# Patient Record
Sex: Female | Born: 1985 | Race: White | Hispanic: No | Marital: Single | Smoking: Former smoker
Health system: Southern US, Community
[De-identification: ages and names within clinical notes are randomized; demographics above are authoritative.]

## PROBLEM LIST (undated history)

## (undated) DIAGNOSIS — E559 Vitamin D deficiency, unspecified: Secondary | ICD-10-CM

## (undated) DIAGNOSIS — F429 Obsessive-compulsive disorder, unspecified: Secondary | ICD-10-CM

## (undated) DIAGNOSIS — R0602 Shortness of breath: Secondary | ICD-10-CM

## (undated) DIAGNOSIS — E669 Obesity, unspecified: Secondary | ICD-10-CM

## (undated) DIAGNOSIS — F319 Bipolar disorder, unspecified: Secondary | ICD-10-CM

## (undated) DIAGNOSIS — F419 Anxiety disorder, unspecified: Secondary | ICD-10-CM

## (undated) DIAGNOSIS — R011 Cardiac murmur, unspecified: Secondary | ICD-10-CM

## (undated) DIAGNOSIS — R5383 Other fatigue: Secondary | ICD-10-CM

## (undated) DIAGNOSIS — K8020 Calculus of gallbladder without cholecystitis without obstruction: Secondary | ICD-10-CM

## (undated) DIAGNOSIS — E88819 Insulin resistance, unspecified: Secondary | ICD-10-CM

## (undated) DIAGNOSIS — E785 Hyperlipidemia, unspecified: Secondary | ICD-10-CM

## (undated) DIAGNOSIS — E8881 Metabolic syndrome: Secondary | ICD-10-CM

## (undated) HISTORY — DX: Anxiety disorder, unspecified: F41.9

## (undated) HISTORY — DX: Vitamin D deficiency, unspecified: E55.9

## (undated) HISTORY — DX: Insulin resistance, unspecified: E88.819

## (undated) HISTORY — DX: Other fatigue: R53.83

## (undated) HISTORY — DX: Shortness of breath: R06.02

## (undated) HISTORY — DX: Bipolar disorder, unspecified: F31.9

## (undated) HISTORY — DX: Obsessive-compulsive disorder, unspecified: F42.9

## (undated) HISTORY — DX: Metabolic syndrome: E88.81

## (undated) HISTORY — DX: Obesity, unspecified: E66.9

## (undated) HISTORY — DX: Hyperlipidemia, unspecified: E78.5

---

## 2010-08-02 ENCOUNTER — Emergency Department (HOSPITAL_COMMUNITY)
Admission: EM | Admit: 2010-08-02 | Discharge: 2010-08-02 | Payer: Self-pay | Source: Home / Self Care | Admitting: Family Medicine

## 2010-10-22 LAB — POCT URINALYSIS DIPSTICK
Protein, ur: 30 mg/dL — AB
Specific Gravity, Urine: >=1.03 (ref 1.005–1.030)
pH: 5.5 (ref 5.0–8.0)

## 2010-10-22 LAB — POCT PREGNANCY, URINE: Preg Test, Ur: NEGATIVE

## 2011-07-04 ENCOUNTER — Other Ambulatory Visit (HOSPITAL_COMMUNITY): Payer: Self-pay | Admitting: Psychiatry

## 2014-09-13 ENCOUNTER — Encounter (HOSPITAL_COMMUNITY): Payer: Self-pay | Admitting: Emergency Medicine

## 2014-09-13 ENCOUNTER — Emergency Department (HOSPITAL_COMMUNITY)
Admission: EM | Admit: 2014-09-13 | Discharge: 2014-09-13 | Disposition: A | Discharge status: Home / Self Care | Payer: BLUE CROSS/BLUE SHIELD | Attending: Emergency Medicine | Admitting: Emergency Medicine

## 2014-09-13 DIAGNOSIS — E669 Obesity, unspecified: Secondary | ICD-10-CM | POA: Insufficient documentation

## 2014-09-13 DIAGNOSIS — Z79899 Other long term (current) drug therapy: Secondary | ICD-10-CM | POA: Insufficient documentation

## 2014-09-13 DIAGNOSIS — R1032 Left lower quadrant pain: Secondary | ICD-10-CM | POA: Diagnosis not present

## 2014-09-13 DIAGNOSIS — Z3202 Encounter for pregnancy test, result negative: Secondary | ICD-10-CM | POA: Diagnosis not present

## 2014-09-13 DIAGNOSIS — R011 Cardiac murmur, unspecified: Secondary | ICD-10-CM | POA: Diagnosis not present

## 2014-09-13 DIAGNOSIS — R102 Pelvic and perineal pain: Secondary | ICD-10-CM | POA: Diagnosis not present

## 2014-09-13 DIAGNOSIS — R109 Unspecified abdominal pain: Secondary | ICD-10-CM

## 2014-09-13 DIAGNOSIS — Z87891 Personal history of nicotine dependence: Secondary | ICD-10-CM | POA: Insufficient documentation

## 2014-09-13 HISTORY — DX: Cardiac murmur, unspecified: R01.1

## 2014-09-13 LAB — COMPREHENSIVE METABOLIC PANEL
ALK PHOS: 67 U/L (ref 39–117)
ALT: 22 U/L (ref 0–35)
ANION GAP: 6 (ref 5–15)
AST: 23 U/L (ref 0–37)
Albumin: 4 g/dL (ref 3.5–5.2)
BILIRUBIN TOTAL: 0.3 mg/dL (ref 0.3–1.2)
BUN: 18 mg/dL (ref 6–23)
CHLORIDE: 106 mmol/L (ref 96–112)
CO2: 27 mmol/L (ref 19–32)
Calcium: 9.2 mg/dL (ref 8.4–10.5)
Creatinine, Ser: 0.92 mg/dL (ref 0.50–1.10)
GFR calc Af Amer: >90 mL/min (ref >90)
GFR, EST NON AFRICAN AMERICAN: 83 mL/min — AB (ref >90)
GLUCOSE: 106 mg/dL — AB (ref 70–99)
Potassium: 3.8 mmol/L (ref 3.5–5.1)
SODIUM: 139 mmol/L (ref 135–145)
TOTAL PROTEIN: 7.6 g/dL (ref 6.0–8.3)

## 2014-09-13 LAB — URINALYSIS, ROUTINE W REFLEX MICROSCOPIC
Bilirubin Urine: NEGATIVE
Glucose, UA: NEGATIVE mg/dL
HGB URINE DIPSTICK: NEGATIVE
Ketones, ur: NEGATIVE mg/dL
Leukocytes, UA: NEGATIVE
Nitrite: NEGATIVE
PH: 6 (ref 5.0–8.0)
Protein, ur: NEGATIVE mg/dL
SPECIFIC GRAVITY, URINE: 1.027 (ref 1.005–1.030)
UROBILINOGEN UA: 0.2 mg/dL (ref 0.0–1.0)

## 2014-09-13 LAB — CBC WITH DIFFERENTIAL/PLATELET
Basophils Absolute: 0.1 10*3/uL (ref 0.0–0.1)
Basophils Relative: 1 % (ref 0–1)
Eosinophils Absolute: 0.5 10*3/uL (ref 0.0–0.7)
Eosinophils Relative: 5 % (ref 0–5)
HEMATOCRIT: 39.3 % (ref 36.0–46.0)
Hemoglobin: 12.5 g/dL (ref 12.0–15.0)
Lymphocytes Relative: 32 % (ref 12–46)
Lymphs Abs: 3.1 10*3/uL (ref 0.7–4.0)
MCH: 26.2 pg (ref 26.0–34.0)
MCHC: 31.8 g/dL (ref 30.0–36.0)
MCV: 82.2 fL (ref 78.0–100.0)
Monocytes Absolute: 0.6 10*3/uL (ref 0.1–1.0)
Monocytes Relative: 6 % (ref 3–12)
NEUTROS ABS: 5.4 10*3/uL (ref 1.7–7.7)
NEUTROS PCT: 56 % (ref 43–77)
PLATELETS: 323 10*3/uL (ref 150–400)
RBC: 4.78 MIL/uL (ref 3.87–5.11)
RDW: 13.5 % (ref 11.5–15.5)
WBC: 9.7 10*3/uL (ref 4.0–10.5)

## 2014-09-13 LAB — WET PREP, GENITAL: TRICH WET PREP: NONE SEEN

## 2014-09-13 LAB — PREGNANCY, URINE: PREG TEST UR: NEGATIVE

## 2014-09-13 LAB — LIPASE, BLOOD: Lipase: 25 U/L (ref 11–59)

## 2014-09-13 MED ORDER — FLUCONAZOLE 150 MG PO TABS
150.0000 mg | ORAL_TABLET | Freq: Once | ORAL | Status: AC
  Administered 2014-09-13: 150 mg via ORAL
  Filled 2014-09-13: qty 1

## 2014-09-13 MED ORDER — IBUPROFEN 800 MG PO TABS: 800.0000 mg | ORAL_TABLET | Freq: Three times a day (TID) | ORAL | Status: DC

## 2014-09-13 MED ORDER — KETOROLAC TROMETHAMINE 30 MG/ML IJ SOLN: 30.0000 mg | Freq: Once | INTRAMUSCULAR | Status: DC

## 2014-09-13 MED ORDER — KETOROLAC TROMETHAMINE 60 MG/2ML IM SOLN
30.0000 mg | Freq: Once | INTRAMUSCULAR | Status: AC
  Administered 2014-09-13: 30 mg via INTRAMUSCULAR
  Filled 2014-09-13: qty 2

## 2014-09-13 MED ORDER — ONDANSETRON HCL 4 MG PO TABS: 4.0000 mg | ORAL_TABLET | Freq: Four times a day (QID) | ORAL | Status: DC

## 2014-09-13 NOTE — Discharge Instructions (Signed)
Abdominal Pain, Women °Abdominal (stomach, pelvic, or belly) pain can be caused by many things. It is important to tell your doctor: °· The location of the pain. °· Does it come and go or is it present all the time? °· Are there things that start the pain (eating certain foods, exercise)? °· Are there other symptoms associated with the pain (fever, nausea, vomiting, diarrhea)? °All of this is helpful to know when trying to find the cause of the pain. °CAUSES  °· Stomach: virus or bacteria infection, or ulcer. °· Intestine: appendicitis (inflamed appendix), regional ileitis (Crohn's disease), ulcerative colitis (inflamed colon), irritable bowel syndrome, diverticulitis (inflamed diverticulum of the colon), or cancer of the stomach or intestine. °· Gallbladder disease or stones in the gallbladder. °· Kidney disease, kidney stones, or infection. °· Pancreas infection or cancer. °· Fibromyalgia (pain disorder). °· Diseases of the female organs: °¨ Uterus: fibroid (non-cancerous) tumors or infection. °¨ Fallopian tubes: infection or tubal pregnancy. °¨ Ovary: cysts or tumors. °¨ Pelvic adhesions (scar tissue). °¨ Endometriosis (uterus lining tissue growing in the pelvis and on the pelvic organs). °¨ Pelvic congestion syndrome (female organs filling up with blood just before the menstrual period). °¨ Pain with the menstrual period. °¨ Pain with ovulation (producing an egg). °¨ Pain with an IUD (intrauterine device, birth control) in the uterus. °¨ Cancer of the female organs. °· Functional pain (pain not caused by a disease, may improve without treatment). °· Psychological pain. °· Depression. °DIAGNOSIS  °Your doctor will decide the seriousness of your pain by doing an examination. °· Blood tests. °· X-rays. °· Ultrasound. °· CT scan (computed tomography, special type of X-ray). °· MRI (magnetic resonance imaging). °· Cultures, for infection. °· Barium enema (dye inserted in the large intestine, to better view it with  X-rays). °· Colonoscopy (looking in intestine with a lighted tube). °· Laparoscopy (minor surgery, looking in abdomen with a lighted tube). °· Major abdominal exploratory surgery (looking in abdomen with a large incision). °TREATMENT  °The treatment will depend on the cause of the pain.  °· Many cases can be observed and treated at home. °· Over-the-counter medicines recommended by your caregiver. °· Prescription medicine. °· Antibiotics, for infection. °· Birth control pills, for painful periods or for ovulation pain. °· Hormone treatment, for endometriosis. °· Nerve blocking injections. °· Physical therapy. °· Antidepressants. °· Counseling with a psychologist or psychiatrist. °· Minor or major surgery. °HOME CARE INSTRUCTIONS  °· Do not take laxatives, unless directed by your caregiver. °· Take over-the-counter pain medicine only if ordered by your caregiver. Do not take aspirin because it can cause an upset stomach or bleeding. °· Try a clear liquid diet (broth or water) as ordered by your caregiver. Slowly move to a bland diet, as tolerated, if the pain is related to the stomach or intestine. °· Have a thermometer and take your temperature several times a day, and record it. °· Bed rest and sleep, if it helps the pain. °· Avoid sexual intercourse, if it causes pain. °· Avoid stressful situations. °· Keep your follow-up appointments and tests, as your caregiver orders. °· If the pain does not go away with medicine or surgery, you may try: °¨ Acupuncture. °¨ Relaxation exercises (yoga, meditation). °¨ Group therapy. °¨ Counseling. °SEEK MEDICAL CARE IF:  °· You notice certain foods cause stomach pain. °· Your home care treatment is not helping your pain. °· You need stronger pain medicine. °· You want your IUD removed. °· You feel faint or   lightheaded.  You develop nausea and vomiting.  You develop a rash.  You are having side effects or an allergy to your medicine. SEEK IMMEDIATE MEDICAL CARE IF:   Your  pain does not go away or gets worse.  You have a fever.  Your pain is felt only in portions of the abdomen. The right side could possibly be appendicitis. The left lower portion of the abdomen could be colitis or diverticulitis.  You are passing blood in your stools (bright red or black tarry stools, with or without vomiting).  You have blood in your urine.  You develop chills, with or without a fever.  You pass out. MAKE SURE YOU:   Understand these instructions.  Will watch your condition.  Will get help right away if you are not doing well or get worse. Document Released: 05/26/2007 Document Revised: 12/13/2013 Document Reviewed: 06/15/2009 [facility] Patient Information 2015 [place], [place]. This information is not intended to replace advice given to you by your health care provider. Make sure you discuss any questions you have with your health care provider.   Emergency Department Resource Guide 1) Find a Doctor and Pay Out of Pocket Although you won't have to find out who is covered by your insurance plan, it is a good idea to ask around and get recommendations. You will then need to call the office and see if the doctor you have chosen will accept you as a new patient and what types of options they offer for patients who are self-pay. Some doctors offer discounts or will set up payment plans for their patients who do not have insurance, but you will need to ask so you aren't surprised when you get to your appointment.  2) Contact Your Local Health Department Not all health departments have doctors that can see patients for sick visits, but many do, so it is worth a call to see if yours does. If you don't know where your local health department is, you can check in your phone book. The CDC also has a tool to help you locate your state's health department, and many state websites also have listings of all of their local health departments.  3) Find a [facility] If your illness  is not likely to be very severe or complicated, you may want to try a walk in clinic. These are popping up all over the country in pharmacies, drugstores, and shopping centers. They're usually staffed by nurse practitioners or physician assistants that have been trained to treat common illnesses and complaints. They're usually fairly quick and inexpensive. However, if you have serious medical issues or chronic medical problems, these are probably not your best option.  No Primary Care Doctor: - Call Health Connect at  [phone] - they can help you locate a primary care doctor that  accepts your insurance, provides certain services, etc. - Physician Referral Service- [phone]  Chronic Pain Problems: Organization         Address  Phone   Notes  [facility]  [phone] Patients need to be referred by their primary care doctor.   Medication Assistance: Organization         Address  Phone   Notes  [facility] Medication [facility] [address] [place], [place] [address] [phone] --Must be a resident of [facility] -- Must have NO insurance coverage whatsoever (no Medicaid/ Medicare, etc.) -- The pt. MUST have a primary care doctor that directs their care  regularly and follows them in the community   [facility]ist  [phone]   [employer]  [phone]    Agencies that provide inexpensive medical care: Organization         Address  Phone   Notes  [place]  [phone]   [facility]    [phone]   [facility] [place], [place] [address] [phone]   [place] [address], [place] [phone]   Planned Parenthood    [phone]   [facility]    [phone]   [place] and [address], [place] Phone:  [phone], Fax:  [phone] Hours of Operation:  9 am - 6  pm, M-F.  Also accepts Medicaid/Medicare and self-pay.  [facility] for [place], [address], [place] Phone: [phone], Fax: [phone]. Hours of Operation:  8:30 am - 5:30 pm, M-F.  Also accepts Medicaid and self-pay.  [facility] [place] [address] [place] Phone: [phone]   [place], [place], [place] [phone], Ext. [phone] Mondays & Thursdays: 7-9 AM.  First 15 patients are seen on a first come, first serve basis.    [place] Providers:  Organization         Address  Phone   Notes  [facility] [address], [place] [phone] Also accepts self-pay patients.  [facility] [address], [place]  [phone]   [place], [address], [place] [phone]   [facility] [address], [place] [phone]   Lucianne Lei [address], [place]   [phone] Only accepts [place] Access [place] patients after they have their name applied to their card.   Self-Pay (no insurance) in [facility]:  Organization         Address  Phone   Notes  Sickle Cell Patients, [facility] [place] [phone]   [facility] [place] [phone]   [facility] [place]  [place], [place], [place] [phone]   [facility]/Dr. Osei-Bonsu  [address] [place] or [address], [place] [phone] Phone number for both [place] and [place] locations is the same.  [facility]gent [facility] and [facility] [address], [place] [phone]   [facility] [address], [place] or [address] [phone]   [facility] [address] [place] [phone], phone; [phone], fax Sees patients 1st and 3rd Saturday of every month.  Must not qualify for public or private insurance (i.e. Medicaid, Medicare, [place] Health Choice, Veterans' Benefits)  Household income should be no more than 200% of the poverty level The clinic cannot treat you if you are pregnant or think you are pregnant  Sexually transmitted diseases are not treated at the clinic.    Dental Care: Organization         Address  Phone  Notes  [facility] Department of [facility] [address] [place], [place] [phone] Accepts children up to age 11  who are enrolled in Medicaid or [place] Health Choice; pregnant women with a Medicaid card; and children who have applied for Medicaid or [place] Health Choice, but were declined, whose parents can pay a reduced fee at time of service.  [facility] Department of [facility]  [address], [place] [phone] Accepts children up to age 30 who are enrolled in [place] or [place]; pregnant women with a Medicaid card; and children who have applied for Medicaid or [place] Health Choice, but were declined, whose parents can pay a reduced fee at time of service.  [place] Adult Dental Access PROGRAM  [place] [phone] Patients are seen by appointment only. Walk-ins are not accepted. [place] will see patients 1 years of age and older. Monday - Tuesday (8am-5pm) Most Wednesdays (8:30-5pm) $30 per visit, cash only  [facility] Adult Dental Access PROGRAM  [address], [facility] [phone] Patients are seen by appointment only. Walk-ins are not accepted. [place] will see patients 80 years of age and older. One Wednesday Evening (Monthly: Volunteer Based).  $30 per visit, cash only  [place]  [phone] for adults; Children under age 69, call Graduate Pediatric Dentistry at [phone]. Children aged 71-14, please call [phone] to request a pediatric application.  Dental services are provided in all areas of dental care including fillings, crowns and bridges, complete and partial dentures, implants, gum treatment, root canals, and extractions. Preventive care is also provided. Treatment is provided to both adults and children. Patients are selected via a lottery and there is often a waiting list.   [facility] [address], [place]  [phone] [email or web address]   [facility] [address] [place], [place] [phone], Ext. [phone] Second and Fourth Thursday of each month, opens at 6:30 AM; Clinic ends at 9 AM.  Patients are seen on a first-come first-served basis, and a limited number are seen during each clinic.   [facility]  [address] [place] Danker [place], [place] [phone]   Eligibility Requirements You must have lived in [place], [place], or [place] counties for at least the last three months.   You cannot be eligible for state or federal sponsored [employer], including [employer], [place], or [employer].   You generally cannot be eligible for healthcare insurance through your employer.    How to apply: Eligibility screenings are held every Tuesday and Wednesday afternoon from 1:00 pm until 4:00 pm. You do not need an appointment for the interview!  [facility] [address], [place], [place]   [place]  [place] Department  [place]  [phone]    Behavioral Health Resources in the Community: Intensive Outpatient Programs Organization         Address  Phone  Notes  [place]. [address] [place], [place] [phone]   [facility] [address], [place], [place]   ADS: Alcohol & Drug Svcs  [address] [place], [place]   [place] [address]  [place], [place] or [phone]   Substance Abuse Resources Organization         Address  Phone  Notes  Alcohol and Drug Services  [phone]   [facility]  [place]   Chinita Pester  [phone]   Residential & Outpatient Substance Abuse Program  [phone]   Psychological Services Organization         Address  Phone  Notes  [place]  [place]  [phone]   [place] [address] [place] or [phone]    Mobile Crisis Teams Organization         Address  Phone  Notes  [facility], Mobile Crisis Care Unit  [phone]   [facility]  [address] [place], [place]   [place] [address], [place] [phone]    Self-Help/Support Groups Organization         Address  Phone             Notes  [place]. of [place] - variety of support groups  [place] Call for more information  Narcotics Anonymous (NA), [facility] [address], [employer]   2 meetings at this location   Special educational needs teacher         Address  Phone  Notes  [facility]idential [facility] [place],    [place]  [phone]   [facility]  [address], [place] [address], [place], [place]   [place], [place] [phone] Admissions: 8am-3pm M-F  Incentives Substance [place] [address]    [place], [place] [phone]   [facility] [address] [place], [place], [place]   [facility] [address]  [place], [place]   [facility] - Intensive Outpatient [address], [place], [place]   [facility] [address]  [place], [place] [phone] or [phone]   [facility] Treatment Services (RTS) [address], [place], [place] Accepts Medicaid  [facility] [place] [address]  [place] [phone] Substance Abuse/Addiction Treatment   [facility] Organization         Address  Phone  Notes  [facility]  [phone]   Domenic Schwab, PhD [address] [place], [place]   [phone] or [phone]   [place], [place] [phone]   [facility] [address], [place], [place] [phone] Insurance/Medicaid/sponsorship through [facility] and Families [address] [place]                                    [place], [place] [phone] [place] [address], [place] [phone]    Dr. Adele Schilder  [phone]   [facility] Dept. 1) [address], [place] 2) [place] 3)  [place] [address], [place] [phone]  [phone]   [place] [phone] or [phone] (After Hours)      You were evaluated in the ED today for abdominal discomfort. There does not appear to be an emergent cause for your symptoms at this time. It is important for you to follow-up with primary care, [place] and wellness within 48 hours for  further evaluation and management of your symptoms. Please keep your regularly scheduled appointment with your GYN for definitive care. Please take your medicines as prescribed. Return to ED for new or worsening symptoms.

## 2014-09-13 NOTE — ED Notes (Signed)
Pt states she had IUD in place for 4.5 years, IUD fell out on 08-13-14, some swelling, period on 09-01-14, terrible cramping on left lower side radiating to side, with intermittent bursts of increased sharpness.

## 2014-09-13 NOTE — ED Provider Notes (Signed)
CSN: [id number]     Arrival date & time 09/13/14  1849 History   First MD Initiated Contact with Patient 09/13/14 2017     Chief Complaint  Patient presents with  . Abdominal Pain     (Consider location/radiation/quality/duration/timing/severity/associated sxs/prior Treatment) HPI Brianna Stokes is a 29 y.o. female who comes in for evaluation of left sided pelvic crampiness that is "different from menstrual cramping". She said this discomfort started on Sunday evening and is crampy with sudden sharp , shooting sensations. She has tried Tylenol, ibuprofen, Motrin, heating pads and hot showers without relief. She rates her discomfort as a 7/10 at this time. She reports she had an IUD in place for 4.5 years, but it fell out on January 2. She has an appointment with her GYN next Friday for further evaluation and management of her symptoms. No vaginal bleeding or discharge. Last bowel movement this morning at 11 and was normal for her. Denies any other symptoms at this time. Denies fevers, headache, vision changes, chest pain, shortness of breath, cough, nausea or vomiting, diarrhea or constipation, urinary symptoms, numbness or weakness, syncope, rash.  Past Medical History  Diagnosis Date  . Heart murmur   . Vaginal delivery    History reviewed. No pertinent past surgical history. History reviewed. No pertinent family history. History  Substance Use Topics  . Smoking status: Former Research scientist (life sciences)  . Smokeless tobacco: Not on file  . Alcohol Use: Yes     Comment: ocassionally   OB History    No data available     Review of Systems  All other systems reviewed and are negative. A 10 point review of systems was completed and was negative except for pertinent positives and negatives as mentioned in the history of present illness    Allergies  Review of patient's allergies indicates no known allergies.  Home Medications   Prior to Admission medications   Medication Sig Start Date End Date  Taking? Authorizing Provider  acetaminophen (TYLENOL) 500 MG tablet Take 1,000 mg by mouth every 6 (six) hours as needed for headache (headache).   Yes Historical Provider, MD  hydrOXYzine (ATARAX/VISTARIL) 10 MG tablet Take 10 mg by mouth 3 (three) times daily as needed for anxiety (anxiety).   Yes Historical Provider, MD  lamoTRIgine (LAMICTAL) 25 MG tablet Take 50 mg by mouth 2 (two) times daily. Take 2 tablets (50 mg) By Mouth BID.   Yes Historical Provider, MD  sertraline (ZOLOFT) 100 MG tablet Take 100 mg by mouth at bedtime.   Yes Historical Provider, MD  ibuprofen (ADVIL,MOTRIN) 800 MG tablet Take 1 tablet (800 mg total) by mouth 3 (three) times daily. 09/13/14   Viona Gilmore [place]ondale Nies, PA-C  ondansetron (ZOFRAN) 4 MG tablet Take 1 tablet (4 mg total) by mouth every 6 (six) hours. 09/13/14   Viona Gilmore Chancey Ringel, PA-C   BP 103/57 mmHg  Pulse 80  Temp(Src) 97.4 F (36.3 C) (Oral)  Resp 18  SpO2 99%  LMP 09/01/2014 Physical Exam  Constitutional: She is oriented to person, place, and time. She appears well-developed and well-nourished.  Obese  HENT:  Head: Normocephalic and atraumatic.  Mouth/Throat: Oropharynx is clear and moist.  Eyes: Conjunctivae are normal. Pupils are equal, round, and reactive to light. Right eye exhibits no discharge. Left eye exhibits no discharge. No scleral icterus.  Neck: Neck supple.  Cardiovascular: Normal rate, regular rhythm and normal heart sounds.   Pulmonary/Chest: Effort normal and breath sounds normal. No respiratory distress. She has no  wheezes. She has no rales.  Abdominal: Soft.  TTP in LLQ and Left pelvis. Abdomen otherwise soft, nondistended, nontender. No rebound or guarding. No other lesions or deformities noted.  Genitourinary:  Chaperone was present for the entire genital exam. No lesions or rashes appreciated on vulva. Cervix visualized on speculum exam and appropriate cultures sampled. Scant blood in vaginal vault. Discharge: Scant white,  thick Upon bi manual exam- No TTP of the adnexa, no cervical motion tenderness. No fullness or masses appreciated. No abnormalities appreciated in structural anatomy.   Musculoskeletal: She exhibits no tenderness.  Neurological: She is alert and oriented to person, place, and time.  Cranial Nerves II-XII grossly intact  Skin: Skin is warm and dry. No rash noted.  Psychiatric: She has a normal mood and affect.  Nursing note and vitals reviewed.   ED Course  Procedures (including critical care time) Labs Review Labs Reviewed  WET PREP, GENITAL - Abnormal; Notable for the following:    Yeast Wet Prep HPF POC RARE (*)    Clue Cells Wet Prep HPF POC FEW (*)    WBC, Wet Prep HPF POC RARE (*)    All other components within normal limits  COMPREHENSIVE METABOLIC PANEL - Abnormal; Notable for the following:    Glucose, Bld 106 (*)    GFR calc non Af Amer 83 (*)    All other components within normal limits  CBC WITH DIFFERENTIAL/PLATELET  LIPASE, BLOOD  URINALYSIS, ROUTINE W REFLEX MICROSCOPIC  PREGNANCY, URINE  GC/CHLAMYDIA PROBE AMP ([place])    Imaging Review No results found.   EKG Interpretation None     Meds given in ED:  Medications  ketorolac (TORADOL) injection 30 mg (30 mg Intramuscular Given 09/13/14 2240)  fluconazole (DIFLUCAN) tablet 150 mg (150 mg Oral Given 09/13/14 2259)    Discharge Medication List as of 09/13/2014 10:45 PM    START taking these medications   Details  ondansetron (ZOFRAN) 4 MG tablet Take 1 tablet (4 mg total) by mouth every 6 (six) hours., Starting 09/13/2014, Until Discontinued, Print       Filed Vitals:   09/13/14 1900 09/13/14 2241  BP: 138/96 103/57  Pulse: 81 80  Temp: 97.4 F (36.3 C)   TempSrc: Oral   Resp: 18 18  SpO2: 100% 99%    MDM  Vitals stable - WNL -afebrile Pt resting comfortably in ED. pain managed in ED. Reports she feels better. PE--no evidence of acute or surgical abdomen. Labwork--evidence of yeast on wet  prep, will treat empirically in ED with Diflucan.  DDX--clinical picture not consistent with PID. Low concern for ovarian torsion, TOA, ectopic. Pt has f/u appt with her GYN next week. Will DC with Zofran, NSAIDs. I discussed all relevant lab findings and imaging results with pt and they verbalized understanding. Discussed f/u with PCP within 48 hrs and return precautions, pt very amenable to plan.  Final diagnoses:  Abdominal discomfort        Verl Dicker, PA-C 09/14/14 [place], MD 09/17/14 [phone]

## 2014-09-14 ENCOUNTER — Emergency Department (HOSPITAL_COMMUNITY): Payer: BLUE CROSS/BLUE SHIELD

## 2014-09-14 ENCOUNTER — Emergency Department (HOSPITAL_COMMUNITY)
Admission: EM | Admit: 2014-09-14 | Discharge: 2014-09-14 | Disposition: A | Discharge status: Home / Self Care | Payer: BLUE CROSS/BLUE SHIELD | Attending: Emergency Medicine | Admitting: Emergency Medicine

## 2014-09-14 ENCOUNTER — Encounter (HOSPITAL_COMMUNITY): Payer: Self-pay

## 2014-09-14 DIAGNOSIS — Z79899 Other long term (current) drug therapy: Secondary | ICD-10-CM | POA: Insufficient documentation

## 2014-09-14 DIAGNOSIS — R011 Cardiac murmur, unspecified: Secondary | ICD-10-CM | POA: Insufficient documentation

## 2014-09-14 DIAGNOSIS — R1013 Epigastric pain: Secondary | ICD-10-CM

## 2014-09-14 DIAGNOSIS — Z87891 Personal history of nicotine dependence: Secondary | ICD-10-CM | POA: Insufficient documentation

## 2014-09-14 DIAGNOSIS — Z3202 Encounter for pregnancy test, result negative: Secondary | ICD-10-CM | POA: Insufficient documentation

## 2014-09-14 DIAGNOSIS — R112 Nausea with vomiting, unspecified: Secondary | ICD-10-CM

## 2014-09-14 DIAGNOSIS — K529 Noninfective gastroenteritis and colitis, unspecified: Secondary | ICD-10-CM

## 2014-09-14 DIAGNOSIS — R10811 Right upper quadrant abdominal tenderness: Secondary | ICD-10-CM

## 2014-09-14 DIAGNOSIS — R197 Diarrhea, unspecified: Secondary | ICD-10-CM

## 2014-09-14 LAB — COMPREHENSIVE METABOLIC PANEL
ALBUMIN: 4.1 g/dL (ref 3.5–5.2)
ALT: 23 U/L (ref 0–35)
ANION GAP: 8 (ref 5–15)
AST: 23 U/L (ref 0–37)
Alkaline Phosphatase: 68 U/L (ref 39–117)
BUN: 20 mg/dL (ref 6–23)
CALCIUM: 9 mg/dL (ref 8.4–10.5)
CO2: 24 mmol/L (ref 19–32)
CREATININE: 0.93 mg/dL (ref 0.50–1.10)
Chloride: 106 mmol/L (ref 96–112)
GFR calc Af Amer: >90 mL/min (ref >90)
GFR calc non Af Amer: 82 mL/min — ABNORMAL LOW (ref >90)
GLUCOSE: 95 mg/dL (ref 70–99)
Potassium: 3.9 mmol/L (ref 3.5–5.1)
SODIUM: 138 mmol/L (ref 135–145)
TOTAL PROTEIN: 7.3 g/dL (ref 6.0–8.3)
Total Bilirubin: 0.6 mg/dL (ref 0.3–1.2)

## 2014-09-14 LAB — URINALYSIS, ROUTINE W REFLEX MICROSCOPIC
GLUCOSE, UA: NEGATIVE mg/dL
Hgb urine dipstick: NEGATIVE
Ketones, ur: NEGATIVE mg/dL
Nitrite: NEGATIVE
PH: 5.5 (ref 5.0–8.0)
Protein, ur: NEGATIVE mg/dL
Specific Gravity, Urine: 1.022 (ref 1.005–1.030)
Urobilinogen, UA: 0.2 mg/dL (ref 0.0–1.0)

## 2014-09-14 LAB — CBC WITH DIFFERENTIAL/PLATELET
Basophils Absolute: 0.1 K/uL (ref 0.0–0.1)
Basophils Relative: 1 % (ref 0–1)
Eosinophils Absolute: 0.6 K/uL (ref 0.0–0.7)
Eosinophils Relative: 6 % — ABNORMAL HIGH (ref 0–5)
HCT: 40.7 % (ref 36.0–46.0)
Hemoglobin: 13 g/dL (ref 12.0–15.0)
Lymphocytes Relative: 32 % (ref 12–46)
Lymphs Abs: 3.3 K/uL (ref 0.7–4.0)
MCH: 25.9 pg — ABNORMAL LOW (ref 26.0–34.0)
MCHC: 31.9 g/dL (ref 30.0–36.0)
MCV: 81.1 fL (ref 78.0–100.0)
Monocytes Absolute: 0.6 K/uL (ref 0.1–1.0)
Monocytes Relative: 6 % (ref 3–12)
Neutro Abs: 5.6 K/uL (ref 1.7–7.7)
Neutrophils Relative %: 55 % (ref 43–77)
Platelets: 311 K/uL (ref 150–400)
RBC: 5.02 MIL/uL (ref 3.87–5.11)
RDW: 13.4 % (ref 11.5–15.5)
WBC: 10.2 K/uL (ref 4.0–10.5)

## 2014-09-14 LAB — I-STAT TROPONIN, ED: Troponin i, poc: 0 ng/mL (ref 0.00–0.08)

## 2014-09-14 LAB — LIPASE, BLOOD: Lipase: 23 U/L (ref 11–59)

## 2014-09-14 LAB — GC/CHLAMYDIA PROBE AMP (~~LOC~~) NOT AT ARMC
Chlamydia: NEGATIVE
NEISSERIA GONORRHEA: NEGATIVE

## 2014-09-14 LAB — URINE MICROSCOPIC-ADD ON

## 2014-09-14 LAB — POC URINE PREG, ED: Preg Test, Ur: NEGATIVE

## 2014-09-14 MED ORDER — MORPHINE SULFATE 4 MG/ML IJ SOLN
4.0000 mg | Freq: Once | INTRAMUSCULAR | Status: AC
  Administered 2014-09-14: 4 mg via INTRAVENOUS
  Filled 2014-09-14: qty 1

## 2014-09-14 MED ORDER — GI COCKTAIL ~~LOC~~
30.0000 mL | Freq: Once | ORAL | Status: AC
  Administered 2014-09-14: 30 mL via ORAL
  Filled 2014-09-14: qty 30

## 2014-09-14 MED ORDER — OMEPRAZOLE 20 MG PO CPDR: 20.0000 mg | DELAYED_RELEASE_CAPSULE | Freq: Every day | ORAL | Status: DC

## 2014-09-14 MED ORDER — MORPHINE SULFATE 4 MG/ML IJ SOLN
8.0000 mg | Freq: Once | INTRAMUSCULAR | Status: AC
  Administered 2014-09-14: 8 mg via INTRAVENOUS
  Filled 2014-09-14: qty 2

## 2014-09-14 MED ORDER — ONDANSETRON 4 MG PO TBDP
4.0000 mg | ORAL_TABLET | Freq: Once | ORAL | Status: AC
  Administered 2014-09-14: 4 mg via ORAL
  Filled 2014-09-14: qty 1

## 2014-09-14 MED ORDER — HYDROCODONE-ACETAMINOPHEN 5-325 MG PO TABS: 1.0000 | ORAL_TABLET | ORAL | Status: DC | PRN

## 2014-09-14 MED ORDER — SODIUM CHLORIDE 0.9 % IV BOLUS (SEPSIS)
500.0000 mL | Freq: Once | INTRAVENOUS | Status: AC
  Administered 2014-09-14: 500 mL via INTRAVENOUS

## 2014-09-14 MED ORDER — PANTOPRAZOLE SODIUM 40 MG IV SOLR
40.0000 mg | Freq: Once | INTRAVENOUS | Status: AC
  Administered 2014-09-14: 40 mg via INTRAVENOUS
  Filled 2014-09-14: qty 40

## 2014-09-14 MED ORDER — ONDANSETRON 8 MG PO TBDP: 8.0000 mg | ORAL_TABLET | Freq: Three times a day (TID) | ORAL | Status: DC | PRN

## 2014-09-14 NOTE — ED Notes (Signed)
Pt c/o generalized abdominal pain, low back pain, and emesis x 3 days.  Pain score 10/10.  [facility], but sts that pain has increased.  Reports taking prescribed medication w/o relief.

## 2014-09-14 NOTE — ED Provider Notes (Signed)
CSN: [id number]     Arrival date & time 09/14/14  1849 History   First MD Initiated Contact with Patient 09/14/14 2000     Chief Complaint  Patient presents with  . Abdominal Pain  . Emesis     (Consider location/radiation/quality/duration/timing/severity/associated sxs/prior Treatment) HPI Comments: Brianna Stokes is a 28 y.o. Healthy female, who presents to the ED with complaints of epigastric abdominal pain that began last night. She was previously seen for lower abdominal pain that began Sunday, and she had a negative workup yesterday and was given diflucan for a yeast infection and sent home with Motrin. She's been taking Motrin which has caused her lower abdominal pain to resolve, but last night she developed 9/10 dull aching and intermittently stabbing epigastric/RUQ abd pain that radiates to her back, worse with lying flat, and unrelieved with Motrin and heat. She states that she has had 5 episodes of nonbloody nonbilious emesis since last night consisting of stomach contents only, and 3 episodes of loose watery nonbloody diarrhea. She denies any fevers, chills, chest pain, shortness of breath, melena, hematochezia, hematemesis, hematuria, dysuria, flank pain, vaginal bleeding or discharge, belching, obstipation, recent travel, antibiotic use, suspicious food intake, sick contacts, or alcohol use. Her last meal was Oodles and noodles yesterday morning, no food consumption prior to onset of this epigastric pain. LMP 09/01/14  Patient is a 29 y.o. female presenting with abdominal pain and vomiting. The history is provided by the patient. No language interpreter was used.  Abdominal Pain Pain location:  Epigastric Pain quality: aching, dull and stabbing   Pain radiates to:  Back Pain severity:  Severe (9/10) Onset quality:  Gradual Duration:  1 day Timing:  Constant Progression:  Unchanged Chronicity:  New Context: recent illness   Relieved by:  Nothing Worsened by:  Position changes  (laying flat) Ineffective treatments:  Heat and NSAIDs Associated symptoms: diarrhea, nausea and vomiting   Associated symptoms: no belching, no chest pain, no chills, no constipation, no dysuria, no fever, no flatus, no hematemesis, no hematochezia, no hematuria, no melena, no shortness of breath, no vaginal bleeding and no vaginal discharge   Risk factors: NSAID use and obesity   Emesis Associated symptoms: abdominal pain and diarrhea   Associated symptoms: no arthralgias, no chills and no myalgias     Past Medical History  Diagnosis Date  . Heart murmur   . Vaginal delivery    History reviewed. No pertinent past surgical history. History reviewed. No pertinent family history. History  Substance Use Topics  . Smoking status: Former Research scientist (life sciences)  . Smokeless tobacco: Not on file  . Alcohol Use: Yes     Comment: ocassionally   OB History    No data available     Review of Systems  Constitutional: Negative for fever and chills.  Respiratory: Negative for shortness of breath.   Cardiovascular: Negative for chest pain.  Gastrointestinal: Positive for nausea, vomiting, abdominal pain and diarrhea. Negative for constipation, blood in stool, melena, hematochezia, rectal pain, flatus and hematemesis.  Endocrine: Negative for polyuria.  Genitourinary: Negative for dysuria, hematuria, flank pain, vaginal bleeding and vaginal discharge.  Musculoskeletal: Negative for myalgias and arthralgias.  Skin: Negative for color change.  Allergic/Immunologic: Negative for immunocompromised state.  Neurological: Negative for weakness, light-headedness and numbness.   10 Systems reviewed and are negative for acute change except as noted in the HPI.    Allergies  Review of patient's allergies indicates no known allergies.  Home Medications   Prior to  Admission medications   Medication Sig Start Date End Date Taking? Authorizing Provider  acetaminophen (TYLENOL) 500 MG tablet Take 1,000 mg by  mouth every 6 (six) hours as needed for headache (headache).   Yes Historical Provider, MD  hydrOXYzine (ATARAX/VISTARIL) 10 MG tablet Take 10 mg by mouth 3 (three) times daily as needed for anxiety (anxiety).   Yes Historical Provider, MD  ibuprofen (ADVIL,MOTRIN) 800 MG tablet Take 1 tablet (800 mg total) by mouth 3 (three) times daily. 09/13/14  Yes Viona Gilmore Cartner, PA-C  lamoTRIgine (LAMICTAL) 25 MG tablet Take 50 mg by mouth 2 (two) times daily.    Yes Historical Provider, MD  ondansetron (ZOFRAN) 4 MG tablet Take 1 tablet (4 mg total) by mouth every 6 (six) hours. 09/13/14  Yes Viona Gilmore Cartner, PA-C  sertraline (ZOLOFT) 100 MG tablet Take 100 mg by mouth at bedtime.   Yes Historical Provider, MD   BP 120/62 mmHg  Pulse 85  Temp(Src) 97.6 F (36.4 C) (Oral)  Resp 20  SpO2 100%  LMP 09/01/2014 Physical Exam  Constitutional: She is oriented to person, place, and time. Vital signs are normal. She appears well-developed and well-nourished.  Non-toxic appearance. No distress.  HENT:  Head: Normocephalic and atraumatic.  Mouth/Throat: Oropharynx is clear and moist and mucous membranes are normal.  Eyes: Conjunctivae and EOM are normal. Right eye exhibits no discharge. Left eye exhibits no discharge.  Neck: Normal range of motion. Neck supple.  Cardiovascular: Normal rate, regular rhythm, normal heart sounds and intact distal pulses.  Exam reveals no gallop and no friction rub.   No murmur heard. Pulmonary/Chest: Effort normal and breath sounds normal. No respiratory distress. She has no decreased breath sounds. She has no wheezes. She has no rhonchi. She has no rales.  Abdominal: Soft. Normal appearance and bowel sounds are normal. She exhibits no distension. There is tenderness in the right upper quadrant and epigastric area. There is positive Murphy's sign. There is no rigidity, no rebound, no guarding, no CVA tenderness and no tenderness at McBurney's point.    Soft, obese which limits  exam but not obviously distended, +BS throughout, with epigastric and RUQ TTP, no r/g/r, +murphy's, neg mcburney's, no CVA TTP   Musculoskeletal: Normal range of motion.  Neurological: She is alert and oriented to person, place, and time. She has normal strength. No sensory deficit.  Skin: Skin is warm, dry and intact. No rash noted.  Psychiatric: She has a normal mood and affect.  Nursing note and vitals reviewed.   ED Course  Procedures (including critical care time) Labs Review Labs Reviewed  CBC WITH DIFFERENTIAL/PLATELET - Abnormal; Notable for the following:    MCH 25.9 (*)    Eosinophils Relative 6 (*)    All other components within normal limits  COMPREHENSIVE METABOLIC PANEL - Abnormal; Notable for the following:    GFR calc non Af Amer 82 (*)    All other components within normal limits  URINALYSIS, ROUTINE W REFLEX MICROSCOPIC - Abnormal; Notable for the following:    Bilirubin Urine MODERATE (*)    Leukocytes, UA TRACE (*)    All other components within normal limits  URINE MICROSCOPIC-ADD ON - Abnormal; Notable for the following:    Bacteria, UA FEW (*)    All other components within normal limits  LIPASE, BLOOD  I-STAT TROPOININ, ED  POC URINE PREG, ED    Imaging Review US Abdomen Limited  09/14/2014   CLINICAL DATA:  Acute onset of right  upper quadrant abdominal tenderness. Nausea and vomiting.  EXAM: US ABDOMEN LIMITED - RIGHT UPPER QUADRANT  COMPARISON:  None.  FINDINGS: Gallbladder:  No gallstones or wall thickening visualized. No sonographic Murphy sign noted.  Common bile duct:  Diameter: 0.3 cm, within normal limits in caliber.  Liver:  No focal lesion identified. Parenchymal echogenicity is mildly heterogeneous but still within normal limits.  IMPRESSION: Unremarkable ultrasound of the right upper quadrant. Gallbladder unremarkable in appearance.   Electronically Signed   By: Garald Balding M.D.   On: 09/14/2014 21:16     EKG Interpretation None      MDM     Final diagnoses:  RUQ abdominal tenderness  Epigastric abdominal pain  Non-intractable vomiting with nausea, vomiting of unspecified type  Diarrhea  Gastroenteritis    29 y.o. female with abd pain, initially LLQ, seen yesterday with essentially neg work up aside from yeast. No significant pelvic exam findings, and GC/CT neg. Now having epigastric/RUQ pain. +Murphys on exam. Seems somewhat like gastritis vs cholelithiasis. Upreg neg. U/A with bili and trace leuks but doesn't appear to be UTI. Trop neg.  CBC w/diff unremarkable, CMP WNL, lipase WNL. Will obtain RUQ u/s. Zofran given with relief of nausea. Will give morphine, protonix, and GI cocktail and have pt attempt PO fluids. DDx includes gastroenteritis vs gastritis from NSAIDs vs cholelithiasis vs early pancreatitis (normal lipase therefore less likely). Will reassess shortly.   10:04 PM U/S negative. This appears to be gastroenteritis/gastritis. Patient still complaining of pain. Will give 8mg  morphine to help with pain, will continue to push PO fluids but since recent BP was lower, will give small bolus.   11:12 PM Pain somewhat improved. Nausea improved and tolerating PO well. Discussed that this will likely take a few days to calm down if it is gastritis from NSAIDs, or viral gastroenteritis. Will give zofran, prilosec, and vicodin. Discussed f/up with PCP in 1wk for recheck. I explained the diagnosis and have given explicit precautions to return to the ER including for any other new or worsening symptoms. The patient understands and accepts the medical plan as it's been dictated and I have answered their questions. Discharge instructions concerning home care and prescriptions have been given. The patient is STABLE and is discharged to home in good condition.  BP 108/60 mmHg  Pulse 71  Temp(Src) 97.6 F (36.4 C) (Oral)  Resp 16  SpO2 98%  LMP 09/01/2014  Meds ordered this encounter  Medications  . ondansetron (ZOFRAN-ODT)  disintegrating tablet 4 mg    Sig:   . gi cocktail (Maalox,Lidocaine,Donnatal)    Sig:   . pantoprazole (PROTONIX) injection 40 mg    Sig:   . morphine 4 MG/ML injection 4 mg    Sig:   . morphine 4 MG/ML injection 8 mg    Sig:   . sodium chloride 0.9 % bolus 500 mL    Sig:   . omeprazole (PRILOSEC) 20 MG capsule    Sig: Take 1 capsule (20 mg total) by mouth daily.    Dispense:  30 capsule    Refill:  0    Order Specific Question:  Supervising Provider    Answer:  Noemi Chapel D [[id number]]  . ondansetron (ZOFRAN ODT) 8 MG disintegrating tablet    Sig: Take 1 tablet (8 mg total) by mouth every 8 (eight) hours as needed for nausea or vomiting.    Dispense:  10 tablet    Refill:  0    Order Specific Question:  Supervising Provider    Answer:  Noemi Chapel D [[id number]]  . HYDROcodone-acetaminophen (NORCO/VICODIN) 5-325 MG per tablet    Sig: Take 1 tablet by mouth every 4 (four) hours as needed for moderate pain or severe pain.    Dispense:  6 tablet    Refill:  0    Order Specific Question:  Supervising Provider    Answer:  Johnna Acosta [address] [place], PA-C 09/14/14 2313  Wandra Arthurs, MD 09/14/14 [phone]

## 2014-09-14 NOTE — Discharge Instructions (Signed)
Your abdominal pain seems to be related to gastritis or gastroenteritis. Stop taking NSAIDs like aleve or ibuprofen which can aggravate your symptoms. Take prilosec daily. Use vicodin as needed for pain but don't drive while taking this.   Use zofran as prescribed, as needed for nausea. Stay well hydrated with small sips of fluids throughout the day. Follow a BRAT (banana-rice-applesauce-toast) diet as described below for the next 24-48 hours. The 'BRAT' diet is suggested, then progress to diet as tolerated as symptoms abate. Call if bloody stools, persistent diarrhea, vomiting, fever or abdominal pain. Return to ER for changing or worsening of symptoms.  Abdominal (belly) pain can be caused by many things. Your caregiver performed an examination and possibly ordered blood/urine tests and imaging (CT scan, x-rays, ultrasound). Many cases can be observed and treated at home after initial evaluation in the emergency department. Even though you are being discharged home, abdominal pain can be unpredictable. Therefore, you need a repeated exam if your pain does not resolve, returns, or worsens. Most patients with abdominal pain don't have to be admitted to the hospital or have surgery, but serious problems like appendicitis and gallbladder attacks can start out as nonspecific pain. Many abdominal conditions cannot be diagnosed in one visit, so follow-up evaluations are very important. SEEK IMMEDIATE MEDICAL ATTENTION IF YOU DEVELOP ANY OF THE FOLLOWING SYMPTOMS:  The pain does not go away or becomes severe.   A temperature above 101 develops.   Repeated vomiting occurs (multiple episodes).   The pain becomes localized to portions of the abdomen. The right side could possibly be appendicitis. In an adult, the left lower portion of the abdomen could be colitis or diverticulitis.   Blood is being passed in stools or vomit (bright red or black tarry stools).   Return also if you develop chest pain,  difficulty breathing, dizziness or fainting, or become confused, poorly responsive, or inconsolable (young children).  The constipation stays for more than 4 days.   There is belly (abdominal) or rectal pain.   You do not seem to be getting better.   Food Choices to Help Relieve Diarrhea When you have diarrhea, the foods you eat and your eating habits are very important. Choosing the right foods and drinks can help relieve diarrhea. Also, because diarrhea can last up to 7 days, you need to replace lost fluids and electrolytes (such as sodium, potassium, and chloride) in order to help prevent dehydration.  WHAT GENERAL GUIDELINES DO I NEED TO FOLLOW?  Slowly drink 1 cup (8 oz) of fluid for each episode of diarrhea. If you are getting enough fluid, your urine will be clear or pale yellow.  Eat starchy foods. Some good choices include white rice, white toast, pasta, low-fiber cereal, baked potatoes (without the skin), saltine crackers, and bagels.  Avoid large servings of any cooked vegetables.  Limit fruit to two servings per day. A serving is  cup or 1 small piece.  Choose foods with less than 2 g of fiber per serving.  Limit fats to less than 8 tsp (38 g) per day.  Avoid fried foods.  Eat foods that have probiotics in them. Probiotics can be found in certain dairy products.  Avoid foods and beverages that may increase the speed at which food moves through the stomach and intestines (gastrointestinal tract). Things to avoid include:  High-fiber foods, such as dried fruit, raw fruits and vegetables, nuts, seeds, and whole grain foods.  Spicy foods and high-fat foods.  Foods and  beverages sweetened with high-fructose corn syrup, honey, or sugar alcohols such as xylitol, sorbitol, and mannitol. WHAT FOODS ARE RECOMMENDED? Grains White rice. White, [place], or pita breads (fresh or toasted), including plain rolls, buns, or bagels. White pasta. Saltine, soda, or graham crackers.  Pretzels. Low-fiber cereal. Cooked cereals made with water (such as cornmeal, farina, or cream cereals). Plain muffins. Matzo. Melba toast. Zwieback.  Vegetables Potatoes (without the skin). Strained tomato and vegetable juices. Most well-cooked and canned vegetables without seeds. Tender lettuce. Fruits Cooked or canned applesauce, apricots, cherries, fruit cocktail, grapefruit, peaches, pears, or plums. Fresh bananas, apples without skin, cherries, grapes, cantaloupe, grapefruit, peaches, oranges, or plums.  Meat and Other Protein Products Baked or boiled chicken. Eggs. Tofu. Fish. Seafood. Smooth peanut butter. Ground or well-cooked tender beef, ham, veal, lamb, pork, or poultry.  Dairy Plain yogurt, kefir, and unsweetened liquid yogurt. Lactose-free milk, buttermilk, or soy milk. Plain hard cheese. Beverages Sport drinks. Clear broths. Diluted fruit juices (except prune). Regular, caffeine-free sodas such as ginger ale. Water. Decaffeinated teas. Oral rehydration solutions. Sugar-free beverages not sweetened with sugar alcohols. Other Bouillon, broth, or soups made from recommended foods.  The items listed above may not be a complete list of recommended foods or beverages. Contact your dietitian for more options. WHAT FOODS ARE NOT RECOMMENDED? Grains Whole grain, whole wheat, bran, or rye breads, rolls, pastas, crackers, and cereals. Wild or brown rice. Cereals that contain more than 2 g of fiber per serving. Corn tortillas or taco shells. Cooked or dry oatmeal. Granola. Popcorn. Vegetables Raw vegetables. Cabbage, broccoli, Brussels sprouts, artichokes, baked beans, beet greens, corn, kale, legumes, peas, sweet potatoes, and yams. Potato skins. Cooked spinach and cabbage. Fruits Dried fruit, including raisins and dates. Raw fruits. Stewed or dried prunes. Fresh apples with skin, apricots, mangoes, pears, raspberries, and strawberries.  Meat and Other Protein Products Chunky peanut butter.  Nuts and seeds. Beans and lentils. Berniece Salines.  Dairy High-fat cheeses. Milk, chocolate milk, and beverages made with milk, such as milk shakes. Cream. Ice cream. Sweets and Desserts Sweet rolls, doughnuts, and sweet breads. Pancakes and waffles. Fats and Oils Butter. Cream sauces. Margarine. Salad oils. Plain salad dressings. Olives. Avocados.  Beverages Caffeinated beverages (such as coffee, tea, soda, or energy drinks). Alcoholic beverages. Fruit juices with pulp. Prune juice. Soft drinks sweetened with high-fructose corn syrup or sugar alcohols. Other Coconut. Hot sauce. Chili powder. Mayonnaise. Gravy. Cream-based or milk-based soups.  The items listed above may not be a complete list of foods and beverages to avoid. Contact your dietitian for more information. WHAT SHOULD I DO IF I BECOME DEHYDRATED? Diarrhea can sometimes lead to dehydration. Signs of dehydration include dark urine and dry mouth and skin. If you think you are dehydrated, you should rehydrate with an oral rehydration solution. These solutions can be purchased at pharmacies, retail stores, or online.  Drink -1 cup (120-240 mL) of oral rehydration solution each time you have an episode of diarrhea. If drinking this amount makes your diarrhea worse, try drinking smaller amounts more often. For example, drink 1-3 tsp (5-15 mL) every 5-10 minutes.  A general rule for staying hydrated is to drink 1-2 L of fluid per day. Talk to your health care provider about the specific amount you should be drinking each day. Drink enough fluids to keep your urine clear or pale yellow. Document Released: 10/19/2003 Document Revised: 08/03/2013 Document Reviewed: 06/21/2013 [facility] Patient Information 2015 [place], [place]. This information is not intended to replace advice given  to you by your health care provider. Make sure you discuss any questions you have with your health care provider.   Abdominal Pain Many things can cause belly (abdominal)  pain. Most times, the belly pain is not dangerous. Many cases of belly pain can be watched and treated at home. HOME CARE   Do not take medicines that help you go poop (laxatives) unless told to by your doctor.  Only take medicine as told by your doctor.  Eat or drink as told by your doctor. Your doctor will tell you if you should be on a special diet. GET HELP IF:  You do not know what is causing your belly pain.  You have belly pain while you are sick to your stomach (nauseous) or have runny poop (diarrhea).  You have pain while you pee or poop.  Your belly pain wakes you up at night.  You have belly pain that gets worse or better when you eat.  You have belly pain that gets worse when you eat fatty foods.  You have a fever. GET HELP RIGHT AWAY IF:   The pain does not go away within 2 hours.  You keep throwing up (vomiting).  The pain changes and is only in the right or left part of the belly.  You have bloody or tarry looking poop. MAKE SURE YOU:   Understand these instructions.  Will watch your condition.  Will get help right away if you are not doing well or get worse. Document Released: 01/15/2008 Document Revised: 08/03/2013 Document Reviewed: 04/07/2013 [facility] Patient Information 2015 [place], [place]. This information is not intended to replace advice given to you by your health care provider. Make sure you discuss any questions you have with your health care provider.  Gastritis, Adult Gastritis is soreness and puffiness (inflammation) of the lining of the stomach. If you do not get help, gastritis can cause bleeding and sores (ulcers) in the stomach. HOME CARE   Only take medicine as told by your doctor.  If you were given antibiotic medicines, take them as told. Finish the medicines even if you start to feel better.  Drink enough fluids to keep your pee (urine) clear or pale yellow.  Avoid foods and drinks that make your problems worse. Foods you may  want to avoid include:  Caffeine or alcohol.  Chocolate.  Mint.  Garlic and onions.  Spicy foods.  Citrus fruits, including oranges, lemons, or limes.  Food containing tomatoes, including sauce, chili, salsa, and pizza.  Fried and fatty foods.  Eat small meals throughout the day instead of large meals. GET HELP RIGHT AWAY IF:   You have black or dark red poop (stools).  You throw up (vomit) blood. It may look like coffee grounds.  You cannot keep fluids down.  Your belly (abdominal) pain gets worse.  You have a fever.  You do not feel better after 1 week.  You have any other questions or concerns. MAKE SURE YOU:   Understand these instructions.  Will watch your condition.  Will get help right away if you are not doing well or get worse. Document Released: 01/15/2008 Document Revised: 10/21/2011 Document Reviewed: 09/11/2011 [facility] Patient Information 2015 [place], [place]. This information is not intended to replace advice given to you by your health care provider. Make sure you discuss any questions you have with your health care provider.  Viral Gastroenteritis Viral gastroenteritis is also called stomach flu. This illness is caused by a certain type of germ (  virus). It can cause sudden watery poop (diarrhea) and throwing up (vomiting). This can cause you to lose body fluids (dehydration). This illness usually lasts for 3 to 8 days. It usually goes away on its own. HOME CARE   Drink enough fluids to keep your pee (urine) clear or pale yellow. Drink small amounts of fluids often.  Ask your doctor how to replace body fluid losses (rehydration).  Avoid:  Foods high in sugar.  Alcohol.  Bubbly (carbonated) drinks.  Tobacco.  Juice.  Caffeine drinks.  Very hot or cold fluids.  Fatty, greasy foods.  Eating too much at one time.  Dairy products until 24 to 48 hours after your watery poop stops.  You may eat foods with active cultures (probiotics).  They can be found in some yogurts and supplements.  Wash your hands well to avoid spreading the illness.  Only take medicines as told by your doctor. Do not give aspirin to children. Do not take medicines for watery poop (antidiarrheals).  Ask your doctor if you should keep taking your regular medicines.  Keep all doctor visits as told. GET HELP RIGHT AWAY IF:   You cannot keep fluids down.  You do not pee at least once every 6 to 8 hours.  You are short of breath.  You see blood in your poop or throw up. This may look like coffee grounds.  You have belly (abdominal) pain that gets worse or is just in one small spot (localized).  You keep throwing up or having watery poop.  You have a fever.  The patient is a child younger than 3 months, and he or she has a fever.  The patient is a child older than 3 months, and he or she has a fever and problems that do not go away.  The patient is a child older than 3 months, and he or she has a fever and problems that suddenly get worse.  The patient is a baby, and he or she has no tears when crying. MAKE SURE YOU:   Understand these instructions.  Will watch your condition.  Will get help right away if you are not doing well or get worse. Document Released: 01/15/2008 Document Revised: 10/21/2011 Document Reviewed: 05/15/2011 [facility] Patient Information 2015 [place], [place]. This information is not intended to replace advice given to you by your health care provider. Make sure you discuss any questions you have with your health care provider.

## 2014-11-09 ENCOUNTER — Emergency Department (HOSPITAL_COMMUNITY): Payer: BLUE CROSS/BLUE SHIELD

## 2014-11-09 ENCOUNTER — Encounter (HOSPITAL_COMMUNITY): Payer: Self-pay | Admitting: *Deleted

## 2014-11-09 ENCOUNTER — Emergency Department (HOSPITAL_COMMUNITY)
Admission: EM | Admit: 2014-11-09 | Discharge: 2014-11-09 | Disposition: A | Discharge status: Home / Self Care | Payer: BLUE CROSS/BLUE SHIELD | Attending: Emergency Medicine | Admitting: Emergency Medicine

## 2014-11-09 DIAGNOSIS — Z79899 Other long term (current) drug therapy: Secondary | ICD-10-CM | POA: Diagnosis not present

## 2014-11-09 DIAGNOSIS — O9989 Other specified diseases and conditions complicating pregnancy, childbirth and the puerperium: Secondary | ICD-10-CM | POA: Diagnosis present

## 2014-11-09 DIAGNOSIS — R011 Cardiac murmur, unspecified: Secondary | ICD-10-CM | POA: Insufficient documentation

## 2014-11-09 DIAGNOSIS — O26899 Other specified pregnancy related conditions, unspecified trimester: Secondary | ICD-10-CM

## 2014-11-09 DIAGNOSIS — R1032 Left lower quadrant pain: Secondary | ICD-10-CM | POA: Insufficient documentation

## 2014-11-09 DIAGNOSIS — Z3A00 Weeks of gestation of pregnancy not specified: Secondary | ICD-10-CM | POA: Diagnosis not present

## 2014-11-09 DIAGNOSIS — Z3491 Encounter for supervision of normal pregnancy, unspecified, first trimester: Secondary | ICD-10-CM

## 2014-11-09 DIAGNOSIS — O210 Mild hyperemesis gravidarum: Secondary | ICD-10-CM | POA: Insufficient documentation

## 2014-11-09 DIAGNOSIS — R1030 Lower abdominal pain, unspecified: Secondary | ICD-10-CM | POA: Insufficient documentation

## 2014-11-09 DIAGNOSIS — Z87891 Personal history of nicotine dependence: Secondary | ICD-10-CM | POA: Insufficient documentation

## 2014-11-09 DIAGNOSIS — R1031 Right lower quadrant pain: Secondary | ICD-10-CM | POA: Diagnosis not present

## 2014-11-09 DIAGNOSIS — R109 Unspecified abdominal pain: Secondary | ICD-10-CM

## 2014-11-09 LAB — CBC WITH DIFFERENTIAL/PLATELET
BASOS ABS: 0 10*3/uL (ref 0.0–0.1)
BASOS PCT: 0 % (ref 0–1)
Eosinophils Absolute: 0.3 10*3/uL (ref 0.0–0.7)
Eosinophils Relative: 3 % (ref 0–5)
HCT: 38.2 % (ref 36.0–46.0)
HEMOGLOBIN: 12.1 g/dL (ref 12.0–15.0)
Lymphocytes Relative: 28 % (ref 12–46)
Lymphs Abs: 3 10*3/uL (ref 0.7–4.0)
MCH: 25.3 pg — ABNORMAL LOW (ref 26.0–34.0)
MCHC: 31.7 g/dL (ref 30.0–36.0)
MCV: 79.7 fL (ref 78.0–100.0)
MONOS PCT: 7 % (ref 3–12)
Monocytes Absolute: 0.8 10*3/uL (ref 0.1–1.0)
Neutro Abs: 6.7 10*3/uL (ref 1.7–7.7)
Neutrophils Relative %: 62 % (ref 43–77)
Platelets: 301 10*3/uL (ref 150–400)
RBC: 4.79 MIL/uL (ref 3.87–5.11)
RDW: 13.3 % (ref 11.5–15.5)
WBC: 10.8 10*3/uL — AB (ref 4.0–10.5)

## 2014-11-09 LAB — URINALYSIS, ROUTINE W REFLEX MICROSCOPIC
Bilirubin Urine: NEGATIVE
Glucose, UA: NEGATIVE mg/dL
Hgb urine dipstick: NEGATIVE
Ketones, ur: 40 mg/dL — AB
Leukocytes, UA: NEGATIVE
NITRITE: NEGATIVE
Protein, ur: NEGATIVE mg/dL
SPECIFIC GRAVITY, URINE: 1.033 — AB (ref 1.005–1.030)
UROBILINOGEN UA: 0.2 mg/dL (ref 0.0–1.0)
pH: 5.5 (ref 5.0–8.0)

## 2014-11-09 LAB — COMPREHENSIVE METABOLIC PANEL
ALT: 15 U/L (ref 0–35)
AST: 16 U/L (ref 0–37)
Albumin: 4.1 g/dL (ref 3.5–5.2)
Alkaline Phosphatase: 53 U/L (ref 39–117)
Anion gap: 8 (ref 5–15)
BILIRUBIN TOTAL: 0.5 mg/dL (ref 0.3–1.2)
BUN: 12 mg/dL (ref 6–23)
CHLORIDE: 107 mmol/L (ref 96–112)
CO2: 23 mmol/L (ref 19–32)
Calcium: 9 mg/dL (ref 8.4–10.5)
Creatinine, Ser: 0.9 mg/dL (ref 0.50–1.10)
GFR calc Af Amer: >90 mL/min (ref >90)
GFR calc non Af Amer: 86 mL/min — ABNORMAL LOW (ref >90)
GLUCOSE: 90 mg/dL (ref 70–99)
POTASSIUM: 3.5 mmol/L (ref 3.5–5.1)
Sodium: 138 mmol/L (ref 135–145)
Total Protein: 7.8 g/dL (ref 6.0–8.3)

## 2014-11-09 LAB — POC URINE PREG, ED: Preg Test, Ur: POSITIVE — AB

## 2014-11-09 LAB — WET PREP, GENITAL
Clue Cells Wet Prep HPF POC: NONE SEEN
Trich, Wet Prep: NONE SEEN
Yeast Wet Prep HPF POC: NONE SEEN

## 2014-11-09 LAB — HCG, QUANTITATIVE, PREGNANCY: hCG, Beta Chain, Quant, S: 15605 m[IU]/mL — ABNORMAL HIGH (ref <5)

## 2014-11-09 MED ORDER — SODIUM CHLORIDE 0.9 % IV BOLUS (SEPSIS)
1000.0000 mL | Freq: Once | INTRAVENOUS | Status: AC
  Administered 2014-11-09: 1000 mL via INTRAVENOUS

## 2014-11-09 MED ORDER — HYDROCODONE-ACETAMINOPHEN 5-325 MG PO TABS: 1.0000 | ORAL_TABLET | Freq: Four times a day (QID) | ORAL | Status: DC | PRN

## 2014-11-09 MED ORDER — MORPHINE SULFATE 4 MG/ML IJ SOLN
4.0000 mg | Freq: Once | INTRAMUSCULAR | Status: AC
  Administered 2014-11-09: 4 mg via INTRAVENOUS
  Filled 2014-11-09: qty 1

## 2014-11-09 MED ORDER — ACETAMINOPHEN 500 MG PO TABS
500.0000 mg | ORAL_TABLET | Freq: Once | ORAL | Status: AC
  Administered 2014-11-09: 500 mg via ORAL
  Filled 2014-11-09: qty 1

## 2014-11-09 NOTE — ED Provider Notes (Signed)
CSN: [id number]     Arrival date & time 11/09/14  1659 History   First MD Initiated Contact with Patient 11/09/14 1758     Chief Complaint  Patient presents with  . Abdominal Cramping     (Consider location/radiation/quality/duration/timing/severity/associated sxs/prior Treatment) HPI Comments: Brianna Stokes is a 29 y.o. [phone] female who presents to the ED with complaints of lower abdominal cramping that began last night and has progressively worsened. She reports the pain is 9/10, located across her lower abdomen, crampy in nature, constant, waxing and waning in severity radiating to her low back, with no known aggravating factors, and unrelieved with heat. Associated symptoms include nausea. She recently found out she was pregnant, and has an appointment next week with Dr. Orlie Pollen in [place], and is unsure of her last menstrual period due to the fact that she had an IUD which "fell out" and she did not have typical menses after that. She denies any fevers, chills, chest pain, shortness breath, vomiting, diarrhea, constipation, obstipation, melena, hematochezia, dysuria, hematuria, vaginal bleeding or discharge, numbness, tingling, weakness, rashes, arthralgias, myalgias, sick contacts or recent travel, antibiotic use, suspicious food intake, or alcohol use. She does admit to taking 800 mg of ibuprofen on Monday but she does not take them regularly.  Patient is a 29 y.o. female presenting with cramps. The history is provided by the patient. No language interpreter was used.  Abdominal Cramping This is a new problem. The current episode started yesterday. The problem occurs constantly. The problem has been waxing and waning. Associated symptoms include abdominal pain and nausea. Pertinent negatives include no arthralgias, change in bowel habit, chest pain, chills, fever, myalgias, numbness, rash, urinary symptoms, vomiting or weakness. Nothing aggravates the symptoms. She has tried heat for  the symptoms. The treatment provided no relief.    Past Medical History  Diagnosis Date  . Heart murmur   . Vaginal delivery    History reviewed. No pertinent past surgical history. No family history on file. History  Substance Use Topics  . Smoking status: Former Research scientist (life sciences)  . Smokeless tobacco: Not on file  . Alcohol Use: Yes     Comment: ocassionally   OB History    No data available     Review of Systems  Constitutional: Negative for fever and chills.  Respiratory: Negative for shortness of breath.   Cardiovascular: Negative for chest pain.  Gastrointestinal: Positive for nausea and abdominal pain. Negative for vomiting, diarrhea, constipation, blood in stool and change in bowel habit.  Genitourinary: Negative for dysuria, hematuria, flank pain, vaginal bleeding and vaginal discharge.  Musculoskeletal: Negative for myalgias and arthralgias.  Skin: Negative for rash.  Allergic/Immunologic: Negative for immunocompromised state.  Neurological: Negative for weakness and numbness.  Psychiatric/Behavioral: Negative for confusion.   10 Systems reviewed and are negative for acute change except as noted in the HPI.    Allergies  Review of patient's allergies indicates no known allergies.  Home Medications   Prior to Admission medications   Medication Sig Start Date End Date Taking? Authorizing Provider  acetaminophen (TYLENOL) 500 MG tablet Take 1,000 mg by mouth every 6 (six) hours as needed for headache (headache).   Yes Historical Provider, MD  hydrOXYzine (ATARAX/VISTARIL) 10 MG tablet Take 10 mg by mouth 3 (three) times daily as needed for anxiety (anxiety).   Yes Historical Provider, MD  ibuprofen (ADVIL,MOTRIN) 800 MG tablet Take 1 tablet (800 mg total) by mouth 3 (three) times daily. 09/13/14  Yes Comer Locket, PA-C  lamoTRIgine (LAMICTAL) 25 MG tablet Take 75 mg by mouth 2 (two) times daily.    Yes Historical Provider, MD  omeprazole (PRILOSEC) 20 MG capsule Take 1  capsule (20 mg total) by mouth daily. 09/14/14  Yes Mcihael Hinderman Camprubi-Soms, PA-C  sertraline (ZOLOFT) 100 MG tablet Take 100 mg by mouth at bedtime.   Yes Historical Provider, MD  HYDROcodone-acetaminophen (NORCO/VICODIN) 5-325 MG per tablet Take 1 tablet by mouth every 4 (four) hours as needed for moderate pain or severe pain. Patient not taking: Reported on 11/09/2014 09/14/14   Clemma Johnsen Camprubi-Soms, PA-C  ondansetron (ZOFRAN ODT) 8 MG disintegrating tablet Take 1 tablet (8 mg total) by mouth every 8 (eight) hours as needed for nausea or vomiting. Patient not taking: Reported on 11/09/2014 09/14/14   Jossiah Smoak Camprubi-Soms, PA-C  ondansetron (ZOFRAN) 4 MG tablet Take 1 tablet (4 mg total) by mouth every 6 (six) hours. Patient not taking: Reported on 11/09/2014 09/13/14   Comer Locket, PA-C   BP 130/77 mmHg  Pulse 96  Temp(Src) 97.8 F (36.6 C) (Oral)  Resp 19  SpO2 100% Physical Exam  Constitutional: She is oriented to person, place, and time. Vital signs are normal. She appears well-developed and well-nourished.  Non-toxic appearance. No distress.  Afebrile, nontoxic, NAD  HENT:  Head: Normocephalic and atraumatic.  Mouth/Throat: Oropharynx is clear and moist and mucous membranes are normal.  Eyes: Conjunctivae and EOM are normal. Right eye exhibits no discharge. Left eye exhibits no discharge.  Neck: Normal range of motion. Neck supple.  Cardiovascular: Normal rate, regular rhythm, normal heart sounds and intact distal pulses.  Exam reveals no gallop and no friction rub.   No murmur heard. Pulmonary/Chest: Effort normal and breath sounds normal. No respiratory distress. She has no decreased breath sounds. She has no wheezes. She has no rhonchi. She has no rales.  Abdominal: Soft. Normal appearance and bowel sounds are normal. She exhibits no distension. There is tenderness in the right lower quadrant, suprapubic area and left lower quadrant. There is no rigidity, no rebound, no guarding, no  CVA tenderness, no tenderness at McBurney's point and negative Murphy's sign.    Soft, obese but not obviously distended, +BS throughout, with diffuse lower abd tenderness, no r/g/r, neg murphy's, neg mcburney's, no CVA TTP   Genitourinary: Vagina normal and uterus normal. Pelvic exam was performed with patient supine. There is no rash, tenderness or lesion on the right labia. There is no rash, tenderness or lesion on the left labia. Cervix exhibits no motion tenderness, no discharge and no friability. Right adnexum displays tenderness. Right adnexum displays no mass and no fullness. Left adnexum displays tenderness. Left adnexum displays no mass and no fullness. No erythema, tenderness or bleeding in the vagina. No vaginal discharge found.  Chaperone present for exam. No rashes, lesions, or tenderness to external genitalia. No erythema, injury, or tenderness to vaginal mucosa. No vaginal discharge or bleeding within vaginal vault. No adnexal masses or fullness, mild diffuse tenderness. No CMT, cervical friability, or discharge from cervical os, which is closed. Uterus non-deviated, mobile, nonTTP, and without enlargement although difficult to palpate secondary to obese habitus.    Musculoskeletal: Normal range of motion.  Neurological: She is alert and oriented to person, place, and time. She has normal strength. No sensory deficit.  Skin: Skin is warm, dry and intact. No rash noted.  Psychiatric: She has a normal mood and affect.  Nursing note and vitals reviewed.   ED Course  Procedures (including critical care time)  Labs Review Labs Reviewed  WET PREP, GENITAL - Abnormal; Notable for the following:    WBC, Wet Prep HPF POC FEW (*)    All other components within normal limits  URINALYSIS, ROUTINE W REFLEX MICROSCOPIC - Abnormal; Notable for the following:    Specific Gravity, Urine 1.033 (*)    Ketones, ur 40 (*)    All other components within normal limits  HCG, QUANTITATIVE, PREGNANCY -  Abnormal; Notable for the following:    hCG, Beta Chain, Quant, S [id number] (*)    All other components within normal limits  CBC WITH DIFFERENTIAL/PLATELET - Abnormal; Notable for the following:    WBC 10.8 (*)    MCH 25.3 (*)    All other components within normal limits  COMPREHENSIVE METABOLIC PANEL - Abnormal; Notable for the following:    GFR calc non Af Amer 86 (*)    All other components within normal limits  POC URINE PREG, ED - Abnormal; Notable for the following:    Preg Test, Ur POSITIVE (*)    All other components within normal limits  RPR  HIV ANTIBODY (ROUTINE TESTING)  ABO/RH  GC/CHLAMYDIA PROBE AMP ([place])    Imaging Review US Ob Comp Less 14 Wks  11/09/2014   CLINICAL DATA:  Pregnant patient with pelvic cramping. Beta HCG not provided. Unknown last menstrual period.  EXAM: OBSTETRIC <14 WK [place] AND TRANSVAGINAL OB US  TECHNIQUE: Both transabdominal and transvaginal ultrasound examinations were performed for complete evaluation of the gestation as well as the maternal uterus, adnexal regions, and pelvic cul-de-sac. Transvaginal technique was performed to assess early pregnancy.  COMPARISON:  None.  FINDINGS: Intrauterine gestational sac: Small ovoid anechoic structure/fluid in the endometrial canal.  Yolk sac:  Not present.  Embryo:  Not present.  Cardiac Activity: Not present.  MSD: 10.7  mm   5 w   6  d          [place] EDC: 07/06/2015  Maternal uterus/adnexae: Probable fibroid in the lower uterine segment measuring 1.1 x 0.6 x 0.9 cm. The left ovary measures 4.1 x 2.1 x 4.3 cm and contains a 2.1 x 1.9 x 2.9 cm cyst. The right ovary measures 3.0 x 1.6 x 3.4 cm. There is trace pelvic free fluid.  IMPRESSION: 1. Probable early intrauterine gestational sac, but no yolk sac, fetal pole, or cardiac activity yet visualized. Recommend follow-up quantitative B-HCG levels and follow-up US in 14 days to confirm and assess viability. Estimated gestational age based on mean sac diameter of 5  weeks 6 days. This recommendation follows SRU consensus guidelines: Diagnostic Criteria for Nonviable Pregnancy Early in the First Trimester. [employer] Med 2013; 937:1696-78. 2. A 2.9 cm cyst in the left ovary, may represent early corpus luteum.   Electronically Signed   By: Jeb Levering M.D.   On: 11/09/2014 19:55   US Ob Transvaginal  11/09/2014   CLINICAL DATA:  Pregnant patient with pelvic cramping. Beta HCG not provided. Unknown last menstrual period.  EXAM: OBSTETRIC <14 WK [place] AND TRANSVAGINAL OB US  TECHNIQUE: Both transabdominal and transvaginal ultrasound examinations were performed for complete evaluation of the gestation as well as the maternal uterus, adnexal regions, and pelvic cul-de-sac. Transvaginal technique was performed to assess early pregnancy.  COMPARISON:  None.  FINDINGS: Intrauterine gestational sac: Small ovoid anechoic structure/fluid in the endometrial canal.  Yolk sac:  Not present.  Embryo:  Not present.  Cardiac Activity: Not present.  MSD: 10.7  mm  5 w   6  d          [place] EDC: 07/06/2015  Maternal uterus/adnexae: Probable fibroid in the lower uterine segment measuring 1.1 x 0.6 x 0.9 cm. The left ovary measures 4.1 x 2.1 x 4.3 cm and contains a 2.1 x 1.9 x 2.9 cm cyst. The right ovary measures 3.0 x 1.6 x 3.4 cm. There is trace pelvic free fluid.  IMPRESSION: 1. Probable early intrauterine gestational sac, but no yolk sac, fetal pole, or cardiac activity yet visualized. Recommend follow-up quantitative B-HCG levels and follow-up US in 14 days to confirm and assess viability. Estimated gestational age based on mean sac diameter of 5 weeks 6 days. This recommendation follows SRU consensus guidelines: Diagnostic Criteria for Nonviable Pregnancy Early in the First Trimester. [employer] Med 2013; 621:3086-57. 2. A 2.9 cm cyst in the left ovary, may represent early corpus luteum.   Electronically Signed   By: Jeb Levering M.D.   On: 11/09/2014 19:55     EKG  Interpretation None      MDM   Final diagnoses:  Abdominal pain during pregnancy  Single pregnancy in first trimester  Abdominal cramping, bilateral lower quadrant    29 y.o. female here with lower abd cramping that began last night, Upreg+, unsure LMP. No vaginal bleeding. Abd exam revealing diffuse lower abd tenderness without peritoneal signs. VSS. Will obtain pelvic, labs, give fluids and tylenol, and obtain U/S to eval fetal status. Will reassess shortly.   7:44 PM Pelvic revealing some diffuse tenderness, no focal area, no CMT. No discharge noted. CBC w/diff showing WBC 10.8 but noncontributory given that it's isolated. CMP pending. U/A without signs of UTI. Quant HCG, ABO/Rh, and ultrasound pending. Pt still having pain, will give morphine, advised pt of risks being unknown to fetus, pt understands and agrees.  10:18 PM Quant HCG [id number]. CMP unremarkable. Wet prep unremarkable. U/S showing early intrauterine gestational sac, no yolk sac or fetal pole, no cardiac activity, likely due to early gestation, estimates it at [redacted]w[redacted]d. ABO/Rh showing O neg but no vaginal bleeding therefore no need for rhogam. Will have her f/up with her OBGYN in 48hrs for recheck of quant hcg and ongoing management. Pt feeling better now after repeat dose of morphine. Again discussed possible risks, and advised fluids and tylenol but will give small supply of norco for any severe pain. I explained the diagnosis and have given explicit precautions to return to the ER including for any other new or worsening symptoms. The patient understands and accepts the medical plan as it's been dictated and I have answered their questions. Discharge instructions concerning home care and prescriptions have been given. The patient is STABLE and is discharged to home in good condition.  BP 98/62 mmHg  Pulse 88  Temp(Src) 97.8 F (36.6 C) (Oral)  Resp 17  SpO2 99%  Meds ordered this encounter  Medications  . acetaminophen  (TYLENOL) tablet 500 mg    Sig:   . sodium chloride 0.9 % bolus 1,000 mL    Sig:   . morphine 4 MG/ML injection 4 mg    Sig:   . morphine 4 MG/ML injection 4 mg    Sig:   . HYDROcodone-acetaminophen (NORCO) 5-325 MG per tablet    Sig: Take 1 tablet by mouth every 6 (six) hours as needed for severe pain.    Dispense:  6 tablet    Refill:  0    Order Specific Question:  Supervising  Provider    Answer:  Noemi Chapel [address] Camprubi-Soms, PA-C 11/09/14 [place], MD 11/10/14 [phone]

## 2014-11-09 NOTE — ED Notes (Signed)
Pt reports she found out she is pregnant x 2 weeks ago, has a pending OB appt for next week.  Started to have cramping last night, denies any bleeding.

## 2014-11-09 NOTE — Discharge Instructions (Signed)
Your abdominal pain could be multiple things, but the labs today were reassuring. Your pregnancy is so early that it's difficult to determine whether it will ultimately be viable or not, but you will need to see your OBGYN in 48 hours for recheck of your quantitative HCG (today it was 15,600) and for ongoing care. Stay well hydrated. Use tylenol as needed for pain, but you may use norco for severe pain. Use sparingly and don't drive while taking it. Return to the ER for changes or worsening symptoms, or vaginal bleeding.  Abdominal (belly) pain can be caused by many things. Your caregiver performed an examination and possibly ordered blood/urine tests and imaging (CT scan, x-rays, ultrasound). Many cases can be observed and treated at home after initial evaluation in the emergency department. Even though you are being discharged home, abdominal pain can be unpredictable. Therefore, you need a repeated exam if your pain does not resolve, returns, or worsens. Most patients with abdominal pain don't have to be admitted to the hospital or have surgery, but serious problems like appendicitis and gallbladder attacks can start out as nonspecific pain. Many abdominal conditions cannot be diagnosed in one visit, so follow-up evaluations are very important. SEEK IMMEDIATE MEDICAL ATTENTION IF YOU DEVELOP ANY OF THE FOLLOWING SYMPTOMS:  The pain does not go away or becomes severe.   A temperature above 101 develops.   Repeated vomiting occurs (multiple episodes).   The pain becomes localized to portions of the abdomen. The right side could possibly be appendicitis. In an adult, the left lower portion of the abdomen could be colitis or diverticulitis.   Blood is being passed in stools or vomit (bright red or black tarry stools).   Return also if you develop chest pain, difficulty breathing, dizziness or fainting, or become confused, poorly responsive, or inconsolable (young children).  The constipation stays  for more than 4 days.   There is belly (abdominal) or rectal pain.   You do not seem to be getting better.     Abdominal Pain During Pregnancy Belly (abdominal) pain is common during pregnancy. Most of the time, it is not a serious problem. Other times, it can be a sign that something is wrong with the pregnancy. Always tell your doctor if you have belly pain. HOME CARE Monitor your belly pain for any changes. The following actions may help you feel better:  Do not have sex (intercourse) or put anything in your vagina until you feel better.  Rest until your pain stops.  Drink clear fluids if you feel sick to your stomach (nauseous). Do not eat solid food until you feel better.  Only take medicine as told by your doctor.  Keep all doctor visits as told. GET HELP RIGHT AWAY IF:   You are bleeding, leaking fluid, or pieces of tissue come out of your vagina.  You have more pain or cramping.  You keep throwing up (vomiting).  You have pain when you pee (urinate) or have blood in your pee.  You have a fever.  You do not feel your baby moving as much.  You feel very weak or feel like passing out.  You have trouble breathing, with or without belly pain.  You have a very bad headache and belly pain.  You have fluid leaking from your vagina and belly pain.  You keep having watery poop (diarrhea).  Your belly pain does not go away after resting, or the pain gets worse. MAKE SURE YOU:   Understand  these instructions.  Will watch your condition.  Will get help right away if you are not doing well or get worse. Document Released: 07/17/2009 Document Revised: 03/31/2013 Document Reviewed: 02/25/2013 [facility] Patient Information 2015 [place], [place]. This information is not intended to replace advice given to you by your health care provider. Make sure you discuss any questions you have with your health care provider.  First Trimester of Pregnancy The first trimester of  pregnancy is from week 1 until the end of week 12 (months 1 through 3). A week after a sperm fertilizes an egg, the egg will implant on the wall of the uterus. This embryo will begin to develop into a baby. Genes from you and your partner are forming the baby. The female genes determine whether the baby is a boy or a girl. At 6-8 weeks, the eyes and face are formed, and the heartbeat can be seen on ultrasound. At the end of 12 weeks, all the baby's organs are formed.  Now that you are pregnant, you will want to do everything you can to have a healthy baby. Two of the most important things are to get good prenatal care and to follow your health care provider's instructions. Prenatal care is all the medical care you receive before the baby's birth. This care will help prevent, find, and treat any problems during the pregnancy and childbirth. BODY CHANGES Your body goes through many changes during pregnancy. The changes vary from woman to woman.   You may gain or lose a couple of pounds at first.  You may feel sick to your stomach (nauseous) and throw up (vomit). If the vomiting is uncontrollable, call your health care provider.  You may tire easily.  You may develop headaches that can be relieved by medicines approved by your health care provider.  You may urinate more often. Painful urination may mean you have a bladder infection.  You may develop heartburn as a result of your pregnancy.  You may develop constipation because certain hormones are causing the muscles that push waste through your intestines to slow down.  You may develop hemorrhoids or swollen, bulging veins (varicose veins).  Your breasts may begin to grow larger and become tender. Your nipples may stick out more, and the tissue that surrounds them (areola) may become darker.  Your gums may bleed and may be sensitive to brushing and flossing.  Dark spots or blotches (chloasma, mask of pregnancy) may develop on your face. This  will likely fade after the baby is born.  Your menstrual periods will stop.  You may have a loss of appetite.  You may develop cravings for certain kinds of food.  You may have changes in your emotions from day to day, such as being excited to be pregnant or being concerned that something may go wrong with the pregnancy and baby.  You may have more vivid and strange dreams.  You may have changes in your hair. These can include thickening of your hair, rapid growth, and changes in texture. Some women also have hair loss during or after pregnancy, or hair that feels dry or thin. Your hair will most likely return to normal after your baby is born. WHAT TO EXPECT AT YOUR PRENATAL VISITS During a routine prenatal visit:  You will be weighed to make sure you and the baby are growing normally.  Your blood pressure will be taken.  Your abdomen will be measured to track your baby's growth.  The fetal heartbeat will  be listened to starting around week 10 or 12 of your pregnancy.  Test results from any previous visits will be discussed. Your health care provider may ask you:  How you are feeling.  If you are feeling the baby move.  If you have had any abnormal symptoms, such as leaking fluid, bleeding, severe headaches, or abdominal cramping.  If you have any questions. Other tests that may be performed during your first trimester include:  Blood tests to find your blood type and to check for the presence of any previous infections. They will also be used to check for low iron levels (anemia) and Rh antibodies. Later in the pregnancy, blood tests for diabetes will be done along with other tests if problems develop.  Urine tests to check for infections, diabetes, or protein in the urine.  An ultrasound to confirm the proper growth and development of the baby.  An amniocentesis to check for possible genetic problems.  Fetal screens for spina bifida and Down syndrome.  You may need  other tests to make sure you and the baby are doing well. HOME CARE INSTRUCTIONS  Medicines  Follow your health care provider's instructions regarding medicine use. Specific medicines may be either safe or unsafe to take during pregnancy.  Take your prenatal vitamins as directed.  If you develop constipation, try taking a stool softener if your health care provider approves. Diet  Eat regular, well-balanced meals. Choose a variety of foods, such as meat or vegetable-based protein, fish, milk and low-fat dairy products, vegetables, fruits, and whole grain breads and cereals. Your health care provider will help you determine the amount of weight gain that is right for you.  Avoid raw meat and uncooked cheese. These carry germs that can cause birth defects in the baby.  Eating four or five small meals rather than three large meals a day may help relieve nausea and vomiting. If you start to feel nauseous, eating a few soda crackers can be helpful. Drinking liquids between meals instead of during meals also seems to help nausea and vomiting.  If you develop constipation, eat more high-fiber foods, such as fresh vegetables or fruit and whole grains. Drink enough fluids to keep your urine clear or pale yellow. Activity and Exercise  Exercise only as directed by your health care provider. Exercising will help you:  Control your weight.  Stay in shape.  Be prepared for labor and delivery.  Experiencing pain or cramping in the lower abdomen or low back is a good sign that you should stop exercising. Check with your health care provider before continuing normal exercises.  Try to avoid standing for long periods of time. Move your legs often if you must stand in one place for a long time.  Avoid heavy lifting.  Wear low-heeled shoes, and practice good posture.  You may continue to have sex unless your health care provider directs you otherwise. Relief of Pain or Discomfort  Wear a good  support bra for breast tenderness.   Take warm sitz baths to soothe any pain or discomfort caused by hemorrhoids. Use hemorrhoid cream if your health care provider approves.   Rest with your legs elevated if you have leg cramps or low back pain.  If you develop varicose veins in your legs, wear support hose. Elevate your feet for 15 minutes, 3-4 times a day. Limit salt in your diet. Prenatal Care  Schedule your prenatal visits by the twelfth week of pregnancy. They are usually scheduled monthly at  first, then more often in the last 2 months before delivery.  Write down your questions. Take them to your prenatal visits.  Keep all your prenatal visits as directed by your health care provider. Safety  Wear your seat belt at all times when driving.  Make a list of emergency phone numbers, including numbers for family, friends, the hospital, and police and fire departments. General Tips  Ask your health care provider for a referral to a local prenatal education class. Begin classes no later than at the beginning of month 6 of your pregnancy.  Ask for help if you have counseling or nutritional needs during pregnancy. Your health care provider can offer advice or refer you to specialists for help with various needs.  Do not use hot tubs, steam rooms, or saunas.  Do not douche or use tampons or scented sanitary pads.  Do not cross your legs for long periods of time.  Avoid cat litter boxes and soil used by cats. These carry germs that can cause birth defects in the baby and possibly loss of the fetus by miscarriage or stillbirth.  Avoid all smoking, herbs, alcohol, and medicines not prescribed by your health care provider. Chemicals in these affect the formation and growth of the baby.  Schedule a dentist appointment. At home, brush your teeth with a soft toothbrush and be gentle when you floss. SEEK MEDICAL CARE IF:   You have dizziness.  You have mild pelvic cramps, pelvic  pressure, or nagging pain in the abdominal area.  You have persistent nausea, vomiting, or diarrhea.  You have a bad smelling vaginal discharge.  You have pain with urination.  You notice increased swelling in your face, hands, legs, or ankles. SEEK IMMEDIATE MEDICAL CARE IF:   You have a fever.  You are leaking fluid from your vagina.  You have spotting or bleeding from your vagina.  You have severe abdominal cramping or pain.  You have rapid weight gain or loss.  You vomit blood or material that looks like coffee grounds.  You are exposed to [place] measles and have never had them.  You are exposed to fifth disease or chickenpox.  You develop a severe headache.  You have shortness of breath.  You have any kind of trauma, such as from a fall or a car accident. Document Released: 07/23/2001 Document Revised: 12/13/2013 Document Reviewed: 06/08/2013 [facility] Patient Information 2015 [place], [place]. This information is not intended to replace advice given to you by your health care provider. Make sure you discuss any questions you have with your health care provider.  Medicines During Pregnancy During pregnancy, there are medicines that are either safe or unsafe to take. Medicines include prescriptions from your caregiver, over-the-counter medicines, topical creams applied to the skin, and all herbal substances. Medicines are put into either Class A, B, C, or D. Class A and B medicines have been shown to be safe in pregnancy. Class C medicines are also considered to be safe in pregnancy, but these medicines should only be used when necessary. Class D medicines should not be used at all in pregnancy. They can be harmful to a baby.  It is best to take as little medicine as possible while pregnant. However, some medicines are necessary to take for the mother and baby's health. Sometimes, it is more dangerous to stop taking certain medicines than to stay on them. This is often the case  for people with long-term (chronic) conditions such as asthma, diabetes, or high blood  pressure (hypertension). If you are pregnant and have a chronic illness, call your caregiver right away. Bring a list of your medicines and their doses to your appointments. If you are planning to become pregnant, schedule a doctor's appointment and discuss your medicines with your caregiver. Lastly, write down the phone number to your pharmacist. They can answer questions regarding a medicine's class and safety. They cannot give advice as to whether you should or should not be on a medicine.  SAFE AND UNSAFE MEDICINES There is a long list of medicines that are considered safe in pregnancy. Below is a shorter list. For specific medicines, ask your caregiver.  AllergyMedicines Loratadine, cetirizine, and chlorpheniramine are safe to take. Certain nasal steroid sprays are safe. Talk to your caregiver about specific brands that are safe. Analgesics Acetaminophen and acetaminophen with codeine are safe to take. All other nonsteroidal anti-inflammatory drugs (NSAIDS) are not safe. This includes ibuprofen.  Antacids Many over-the-counter antacids are safe to take. Talk to your caregiver about specific brands that are safe. Famotidine, ranitidine, and lansoprazole are safe. Omepresole is considered safe to take in the second trimester. Antibiotic Medicines There are several antibiotics to avoid. These include, but are not limited to, tetracyline, quinolones, and sulfa medications. Talk to your caregiver before taking any antibiotic.  Antihistamines Talk to your caregiver about specific brands that are safe.  Asthma Medicines Most asthma steroid inhalers are safe to take. Talk to your caregiver for specific details. Calcium Calcium supplements are safe to take. Do not take oyster shell calcium.  Cough and Cold Medicines It is safe to take products with guaifenesin or dextromethorphan. Talk to your caregiver about  specific brands that are safe. It is not safe to take products that contain aspirin or ibuprofen. Decongestant Medicines Pseudoephedrine-containing products are safe to take in the second and third trimester.  Depression Medicines Talk about these medicines with your caregiver.  Antidiarrheal Medicines It is safe to take loperamide. Talk to your caregiver about specific brands that are safe. It is not safe to take any antidiarrheal medicine that contains bismuth. Eyedrops Allergy eyedrops should be limited.  Iron It is safe to use certain iron-containing medicines for anemia in pregnancy. They require a prescription.  Antinausea Medicines It is safe to take doxylamine and vitamin B6 as directed. There are other prescription medicines available, if needed.  Sleep aids It is safe to take diphenhydramine and acetaminophen with diphenhydramine.  Steroids Hydrocortisone creams are safe to use as directed. Oral steroids require a prescription. It is not safe to take any hemorrhoid cream with pramoxine or phenylephrine. Stool softener It is safe to take stool softener medicines. Avoid daily or prolonged use of stool softeners. Thyroid Medicine It is important to stay on this thyroid medicine. It needs to be followed by your caregiver.  Vaginal Medicines Your caregiver will prescribe a medicine to you if you have a vaginal infection. Certain antifungal medicines are safe to use if you have a sexually transmitted infection (STI). Talk to your caregiver.  Document Released: 07/29/2005 Document Revised: 10/21/2011 Document Reviewed: 07/30/2011 [facility] Patient Information 2015 [place], [place]. This information is not intended to replace advice given to you by your health care provider. Make sure you discuss any questions you have with your health care provider.

## 2014-11-09 NOTE — ED Notes (Signed)
IV insertion attempted x 2, unable to obtain IV access or blood work. Another RN to attempt.

## 2014-11-10 LAB — GC/CHLAMYDIA PROBE AMP (~~LOC~~) NOT AT ARMC
Chlamydia: NEGATIVE
Neisseria Gonorrhea: NEGATIVE

## 2014-11-10 LAB — HIV ANTIBODY (ROUTINE TESTING W REFLEX): HIV SCREEN 4TH GENERATION: NONREACTIVE

## 2014-11-10 LAB — ABO/RH
ABO/RH(D): O NEG
Antibody Screen: NEGATIVE

## 2014-11-10 LAB — RPR: RPR: NONREACTIVE

## 2015-09-28 ENCOUNTER — Encounter: Payer: Self-pay | Admitting: Internal Medicine

## 2015-09-28 ENCOUNTER — Ambulatory Visit (INDEPENDENT_AMBULATORY_CARE_PROVIDER_SITE_OTHER): Payer: 59 | Admitting: Internal Medicine

## 2015-09-28 VITALS — BP 100/80 | HR 89 | Ht 68.0 in | Wt 193.6 lb

## 2015-09-28 DIAGNOSIS — R55 Syncope and collapse: Secondary | ICD-10-CM | POA: Diagnosis not present

## 2015-09-28 NOTE — Progress Notes (Signed)
ELECTROPHYSIOLOGY CONSULT NOTE  Patient ID: Brianna Stokes, MRN: [id number], DOB/AGE: 31/05/1986 31 y.o. Admit date: (Not on file) Date of Consult: 09/28/2015  Primary Physician: Lorelei Pont, MD [facility] [place]  Chief Complaint: palpitations   HPI Brianna Stokes is a 30 y.o. female  Referred because of palpitations and presyncope.  She introduces herself as having anxiety and borderline personality disorder. She notes that over the last 6 months she's had increasing problems with heat intolerance manifested and showers, orthostatic intolerance with modest standing in exercise intolerance with tachypalpitations upon climbing stairs.  These efforts are all associated with presyncope. She has had no syncope.  She's undergone a fairly extensive evaluation at [facility]. This is included an echocardiogram which noted a restrictive ASD described as less than 10 mm in size. There is a repeat echocardiogram 4 months later i.e. 2/17 described a similar sized ASD. The patient understands that she has a "rapidly enlarging defect".  She's also been told that her palpitations are related to her heart defect.  Her diet is salt deplete as well as fluid deplete.     Past Medical History  Diagnosis Date  . Heart murmur   . Vaginal delivery   . Anxiety   . Bipolar 1 disorder ([place])   . OCD (obsessive compulsive disorder)   . Obesity   . Hyperlipidemia     mixed  . Vitamin D deficiency   . SOB (shortness of breath)   . Fatigue   . Insulin resistance       Surgical History: History reviewed. No pertinent past surgical history.   Home Meds: Prior to Admission medications   Medication Sig Start Date End Date Taking? Authorizing Provider  hydrOXYzine (ATARAX/VISTARIL) 10 MG tablet Take 10 mg by mouth 3 (three) times daily as needed for anxiety (anxiety).   Yes Historical Provider, MD  lamoTRIgine (LAMICTAL) 25 MG tablet Take 75 mg by mouth 2  (two) times daily.    Yes Historical Provider, MD  Liraglutide -Weight Management (SAXENDA) 18 MG/3ML SOPN Inject into the skin daily.   Yes Historical Provider, MD  omeprazole (PRILOSEC) 20 MG capsule Take 1 capsule (20 mg total) by mouth daily. 09/14/14  Yes Mercedes Camprubi-Soms, PA-C  sertraline (ZOLOFT) 100 MG tablet Take 100 mg by mouth daily.   Yes Historical Provider, MD  Topiramate ER (TROKENDI XR) 50 MG CP24 Take 50 mg by mouth daily.   Yes Historical Provider, MD    Allergies: No Known Allergies  Social History   Social History  . Marital Status: Single    Spouse Name: N/A  . Number of Children: N/A  . Years of Education: N/A   Occupational History  . Not on file.   Social History Main Topics  . Smoking status: Former Research scientist (life sciences)  . Smokeless tobacco: Not on file  . Alcohol Use: Yes     Comment: ocassionally  . Drug Use: No  . Sexual Activity: Not on file   Other Topics Concern  . Not on file   Social History Narrative     Family History  Problem Relation Age of Onset  . Depression Mother   . Diabetes Mellitus I Mother      ROS:  Please see the history of present illness.     All other systems reviewed and negative.    Physical Exam:   Blood pressure 100/80, pulse 89, height 5\' 8"  (1.727 m), weight 193 lb 9.6 oz (87.816 kg). General: Well  developed, well nourished female in no acute distress. Head: Normocephalic, atraumatic, sclera non-icteric, no xanthomas, nares are without discharge. EENT: normal  Lymph Nodes:  none Neck: Negative for carotid bruits. JVD not elevated. Back:without scoliosis kyphosis  Lungs: Clear bilaterally to auscultation without wheezes, rales, or rhonchi. Breathing is unlabored. Heart: RRR with S1 S2. 2 */6 systolic murmur . No rubs, or gallops appreciated. Abdomen: Soft, non-tender, non-distended with normoactive bowel sounds. No hepatomegaly. No rebound/guarding. No obvious abdominal masses. Msk:  Strength and tone appear normal for  age. Extremities: No clubbing or cyanosis. No  edema.  Distal pedal pulses are 2+ and equal bilaterally. Skin: Warm and Dry Neuro: Alert and oriented X 3. CN III-XII intact Grossly normal sensory and motor function . Psych:  Responds to questions appropriately with a normal affect.      Labs: Cardiac Enzymes No results for input(s): CKTOTAL, CKMB, TROPONINI in the last 72 hours. CBC Lab Results  Component Value Date   WBC 10.8* 11/09/2014   HGB 12.1 11/09/2014   HCT 38.2 11/09/2014   MCV 79.7 11/09/2014   PLT 301 11/09/2014   PROTIME: No results for input(s): LABPROT, INR in the last 72 hours. Chemistry No results for input(s): NA, K, CL, CO2, BUN, CREATININE, CALCIUM, PROT, BILITOT, ALKPHOS, ALT, AST, GLUCOSE in the last 168 hours.  Invalid input(s): LABALBU Lipids No results found for: CHOL, HDL, LDLCALC, TRIG BNP No results found for: PROBNP Thyroid Function Tests: No results for input(s): TSH, T4TOTAL, T3FREE, THYROIDAB in the last 72 hours.  Invalid input(s): FREET3 Miscellaneous No results found for: DDIMER  Radiology/Studies:  No results found.  EKG: NSR 89 26/09/36   Assessment and Plan:  ASD/VSD  -restrictive  Dysautonomia with tachy palpitations and presyncope  Borderline personality disorder      The patient's echo reports were reviewed. I discussed them with 2 colleagues. I also reviewed up to date.I could find no reason that she couldn't fly. There are issues related to high altitude presumably for large defects and there are strict admonitions against scuba diving.   The clinical implications of the defects which are described as restrictive should be minimal. I confirmed this with up to date. This is in stark contrast to what she understood was told her at [facility]. She was also told at [facility] at her ASD is enlarging rapidly. I told her I was unable to speak to this contradiction. I suggested that perhaps further consultation with a third party  might be appropriate  I also offered to have our physicians review the echoes and felt that she could get copies of the studies  In addition, my interpretation of the mechanism of her palpitations and presyncope is discordant with her understanding that she has a serious heart rhythm problem. We discussed extensively the issues of dysautonomia, the physiology of orthstasis and positional stress.  We discussed the role of salt and water repletion, the importance of exercise, often needing to be started in the recumbent position, and the awareness of triggers and the role of ambient heat and dehydration  She is not certain as to whether my interpretations are credible or not. I have again suggested that she seek out a third party consultation and given her the need for Dr. Norville Haggard . I have also mentioned that there is significant interplay between neuropsychiatric disease and dysautonomic symptoms.  We discussed potential mechanism of onset been abrupt and possibly related to viral illness as well as her poor ability to prognosticate as  to its future course   we spent 1 hour and 45 minutes      Virl Axe

## 2015-09-28 NOTE — Patient Instructions (Signed)
Medication Instructions:  Your physician recommends that you continue on your current medications as directed. Please refer to the Current Medication list given to you today.   Labwork: none  Testing/Procedures: none  Follow-Up: Your physician recommends that you schedule a follow-up appointment in: as needed.    

## 2015-10-03 ENCOUNTER — Encounter: Payer: Self-pay | Admitting: Internal Medicine

## 2015-11-15 ENCOUNTER — Inpatient Hospital Stay (HOSPITAL_COMMUNITY)
Admission: EM | Admit: 2015-11-15 | Discharge: 2015-11-17 | DRG: 418 | Disposition: A | Discharge status: Home / Self Care | Payer: Self-pay | Attending: General Surgery | Admitting: General Surgery

## 2015-11-15 ENCOUNTER — Emergency Department (HOSPITAL_COMMUNITY): Payer: Self-pay

## 2015-11-15 ENCOUNTER — Encounter (HOSPITAL_COMMUNITY): Payer: Self-pay

## 2015-11-15 DIAGNOSIS — Q210 Ventricular septal defect: Secondary | ICD-10-CM

## 2015-11-15 DIAGNOSIS — K8000 Calculus of gallbladder with acute cholecystitis without obstruction: Principal | ICD-10-CM | POA: Diagnosis present

## 2015-11-15 DIAGNOSIS — R1011 Right upper quadrant pain: Secondary | ICD-10-CM

## 2015-11-15 DIAGNOSIS — K8020 Calculus of gallbladder without cholecystitis without obstruction: Secondary | ICD-10-CM | POA: Diagnosis present

## 2015-11-15 DIAGNOSIS — F419 Anxiety disorder, unspecified: Secondary | ICD-10-CM | POA: Diagnosis present

## 2015-11-15 DIAGNOSIS — R1110 Vomiting, unspecified: Secondary | ICD-10-CM

## 2015-11-15 DIAGNOSIS — F429 Obsessive-compulsive disorder, unspecified: Secondary | ICD-10-CM | POA: Diagnosis present

## 2015-11-15 DIAGNOSIS — E785 Hyperlipidemia, unspecified: Secondary | ICD-10-CM | POA: Diagnosis present

## 2015-11-15 DIAGNOSIS — Z87891 Personal history of nicotine dependence: Secondary | ICD-10-CM

## 2015-11-15 DIAGNOSIS — F319 Bipolar disorder, unspecified: Secondary | ICD-10-CM | POA: Diagnosis present

## 2015-11-15 HISTORY — DX: Calculus of gallbladder without cholecystitis without obstruction: K80.20

## 2015-11-15 LAB — CBC
HEMATOCRIT: 37.6 % (ref 36.0–46.0)
HEMOGLOBIN: 11.7 g/dL — AB (ref 12.0–15.0)
MCH: 25.7 pg — AB (ref 26.0–34.0)
MCHC: 31.1 g/dL (ref 30.0–36.0)
MCV: 82.5 fL (ref 78.0–100.0)
Platelets: 278 10*3/uL (ref 150–400)
RBC: 4.56 MIL/uL (ref 3.87–5.11)
RDW: 14.1 % (ref 11.5–15.5)
WBC: 10.2 10*3/uL (ref 4.0–10.5)

## 2015-11-15 LAB — URINALYSIS, ROUTINE W REFLEX MICROSCOPIC
Bilirubin Urine: NEGATIVE
Glucose, UA: NEGATIVE mg/dL
Hgb urine dipstick: NEGATIVE
Ketones, ur: NEGATIVE mg/dL
Leukocytes, UA: NEGATIVE
Nitrite: NEGATIVE
PH: 5 (ref 5.0–8.0)
PROTEIN: NEGATIVE mg/dL
SPECIFIC GRAVITY, URINE: 1.015 (ref 1.005–1.030)

## 2015-11-15 LAB — COMPREHENSIVE METABOLIC PANEL
ALBUMIN: 3.5 g/dL (ref 3.5–5.0)
ALT: 21 U/L (ref 14–54)
ANION GAP: 11 (ref 5–15)
AST: 18 U/L (ref 15–41)
Alkaline Phosphatase: 60 U/L (ref 38–126)
BILIRUBIN TOTAL: 0.4 mg/dL (ref 0.3–1.2)
BUN: 17 mg/dL (ref 6–20)
CHLORIDE: 108 mmol/L (ref 101–111)
CO2: 24 mmol/L (ref 22–32)
Calcium: 9 mg/dL (ref 8.9–10.3)
Creatinine, Ser: 1 mg/dL (ref 0.44–1.00)
GFR calc Af Amer: >60 mL/min (ref >60)
GFR calc non Af Amer: >60 mL/min (ref >60)
GLUCOSE: 107 mg/dL — AB (ref 65–99)
POTASSIUM: 4.1 mmol/L (ref 3.5–5.1)
SODIUM: 143 mmol/L (ref 135–145)
TOTAL PROTEIN: 6.3 g/dL — AB (ref 6.5–8.1)

## 2015-11-15 LAB — LIPASE, BLOOD: Lipase: 36 U/L (ref 11–51)

## 2015-11-15 LAB — POC URINE PREG, ED: PREG TEST UR: NEGATIVE

## 2015-11-15 MED ORDER — ONDANSETRON HCL 4 MG/2ML IJ SOLN
4.0000 mg | Freq: Once | INTRAMUSCULAR | Status: AC
  Administered 2015-11-15: 4 mg via INTRAVENOUS
  Filled 2015-11-15: qty 2

## 2015-11-15 MED ORDER — HYDROMORPHONE HCL 1 MG/ML IJ SOLN
1.0000 mg | INTRAMUSCULAR | Status: DC | PRN
  Administered 2015-11-15 – 2015-11-17 (×9): 1 mg via INTRAVENOUS
  Filled 2015-11-15 (×11): qty 1

## 2015-11-15 MED ORDER — ONDANSETRON HCL 4 MG/2ML IJ SOLN
4.0000 mg | Freq: Four times a day (QID) | INTRAMUSCULAR | Status: DC | PRN
Start: 2015-11-15 — End: 2015-11-17
  Administered 2015-11-15 – 2015-11-17 (×5): 4 mg via INTRAVENOUS
  Filled 2015-11-15 (×5): qty 2

## 2015-11-15 MED ORDER — ENOXAPARIN SODIUM 40 MG/0.4ML ~~LOC~~ SOLN
40.0000 mg | SUBCUTANEOUS | Status: DC
Start: 2015-11-15 — End: 2015-11-17
  Administered 2015-11-16: 40 mg via SUBCUTANEOUS
  Filled 2015-11-15 (×2): qty 0.4

## 2015-11-15 MED ORDER — LACTATED RINGERS IV SOLN
INTRAVENOUS | Status: DC
  Administered 2015-11-15 – 2015-11-16 (×2): via INTRAVENOUS

## 2015-11-15 MED ORDER — DEXTROSE 5 % IV SOLN
2.0000 g | INTRAVENOUS | Status: DC
  Administered 2015-11-15: 2 g via INTRAVENOUS
  Filled 2015-11-15 (×2): qty 2

## 2015-11-15 MED ORDER — ACETAMINOPHEN 325 MG PO TABS
650.0000 mg | ORAL_TABLET | Freq: Four times a day (QID) | ORAL | Status: DC | PRN
  Administered 2015-11-16: 650 mg via ORAL
  Filled 2015-11-15: qty 2

## 2015-11-15 MED ORDER — LAMOTRIGINE 25 MG PO TABS
75.0000 mg | ORAL_TABLET | Freq: Two times a day (BID) | ORAL | Status: DC
  Administered 2015-11-15 – 2015-11-17 (×3): 75 mg via ORAL
  Filled 2015-11-15 (×4): qty 3

## 2015-11-15 MED ORDER — MORPHINE SULFATE (PF) 4 MG/ML IV SOLN
4.0000 mg | Freq: Once | INTRAVENOUS | Status: AC
  Administered 2015-11-15: 4 mg via INTRAVENOUS
  Filled 2015-11-15: qty 1

## 2015-11-15 MED ORDER — TOPIRAMATE 25 MG PO TABS
25.0000 mg | ORAL_TABLET | Freq: Two times a day (BID) | ORAL | Status: DC
  Administered 2015-11-16 – 2015-11-17 (×2): 25 mg via ORAL
  Filled 2015-11-15 (×4): qty 1

## 2015-11-15 MED ORDER — ONDANSETRON 4 MG PO TBDP: 4.0000 mg | ORAL_TABLET | Freq: Four times a day (QID) | ORAL | Status: DC | PRN

## 2015-11-15 MED ORDER — HYDROXYZINE HCL 10 MG PO TABS
10.0000 mg | ORAL_TABLET | Freq: Three times a day (TID) | ORAL | Status: DC | PRN
  Filled 2015-11-15 (×2): qty 1

## 2015-11-15 MED ORDER — SERTRALINE HCL 100 MG PO TABS
100.0000 mg | ORAL_TABLET | Freq: Every day | ORAL | Status: DC
  Administered 2015-11-16: 100 mg via ORAL
  Filled 2015-11-15: qty 1

## 2015-11-15 MED ORDER — SERTRALINE HCL 100 MG PO TABS
100.0000 mg | ORAL_TABLET | Freq: Every day | ORAL | Status: DC
  Administered 2015-11-15: 100 mg via ORAL
  Filled 2015-11-15: qty 1

## 2015-11-15 MED ORDER — ACETAMINOPHEN 650 MG RE SUPP: 650.0000 mg | Freq: Four times a day (QID) | RECTAL | Status: DC | PRN

## 2015-11-15 NOTE — ED Provider Notes (Signed)
CSN: [id number]     Arrival date & time 11/15/15  [id number] History   First MD Initiated Contact with Patient 11/15/15 [phone]     Chief Complaint  Patient presents with  . Abdominal Pain     (Consider location/radiation/quality/duration/timing/severity/associated sxs/prior Treatment) HPI Comments: [name] w/ PMH including bipolar disorder, HLD who p/w abdominal pain. The patient reports a several year history of intermittent upper abdominal pain in her midepigastrium and right upper quadrant. Over the past several months, the abdominal pain has been more constant. Recently, the pain has occurred every time she eats, it doesn't matter what type of food she eats. She was evaluated by her PCP 2 days ago and was diagnosed with gallstones from an ultrasound. She was given pain medication and has been taking hydrocodone at home but this morning woke up at 5 AM with severe, constant right upper quadrant pain. The pain is nonradiating. She has had associated nausea and vomiting. No diarrhea. No urinary symptoms. No vaginal bleeding or discharge.  Patient is a 30 y.o. female presenting with abdominal pain. The history is provided by the patient.  Abdominal Pain   Past Medical History  Diagnosis Date  . Heart murmur   . Vaginal delivery   . Anxiety   . Bipolar 1 disorder ([place])   . OCD (obsessive compulsive disorder)   . Obesity   . Hyperlipidemia     mixed  . Vitamin D deficiency   . SOB (shortness of breath)   . Fatigue   . Insulin resistance   . Gallstones    History reviewed. No pertinent past surgical history. Family History  Problem Relation Age of Onset  . Depression Mother   . Diabetes Mellitus I Mother    Social History  Substance Use Topics  . Smoking status: Former Research scientist (life sciences)  . Smokeless tobacco: None  . Alcohol Use: Yes     Comment: ocassionally   OB History    No data available     Review of Systems  Gastrointestinal: Positive for abdominal pain.    10 Systems reviewed and  are negative for acute change except as noted in the HPI.   Allergies  Review of patient's allergies indicates no known allergies.  Home Medications   Prior to Admission medications   Medication Sig Start Date End Date Taking? Authorizing Provider  hydrOXYzine (ATARAX/VISTARIL) 10 MG tablet Take 10 mg by mouth 3 (three) times daily as needed for anxiety (anxiety).    Historical Provider, MD  lamoTRIgine (LAMICTAL) 25 MG tablet Take 75 mg by mouth 2 (two) times daily.     Historical Provider, MD  Liraglutide -Weight Management (SAXENDA) 18 MG/3ML SOPN Inject into the skin daily.    Historical Provider, MD  omeprazole (PRILOSEC) 20 MG capsule Take 1 capsule (20 mg total) by mouth daily. 09/14/14   Mercedes Camprubi-Soms, PA-C  sertraline (ZOLOFT) 100 MG tablet Take 100 mg by mouth daily.    Historical Provider, MD  Topiramate ER (TROKENDI XR) 50 MG CP24 Take 50 mg by mouth daily.    Historical Provider, MD   BP 112/74 mmHg  Pulse 78  Temp(Src) 97.5 F (36.4 C) (Oral)  Resp 18  Ht 5\' 8"  (1.727 m)  Wt 190 lb (86.183 kg)  BMI 28.90 kg/m2  SpO2 100% Physical Exam  Constitutional: She is oriented to person, place, and time. She appears well-developed and well-nourished.  Uncomfortable, holding R side  HENT:  Head: Normocephalic and atraumatic.  Moist mucous membranes  Eyes:  Conjunctivae are normal. Pupils are equal, round, and reactive to light.  Neck: Neck supple.  Cardiovascular: Normal rate, regular rhythm and normal heart sounds.   No murmur heard. Pulmonary/Chest: Effort normal and breath sounds normal.  Abdominal: Soft. Bowel sounds are normal. She exhibits no distension. There is tenderness. There is no rebound and no guarding.  Mild midepigastric TTP, severe RUQ TTP  Musculoskeletal: She exhibits no edema.  Neurological: She is alert and oriented to person, place, and time.  Fluent speech  Skin: Skin is warm and dry.  Psychiatric: She has a normal mood and affect. Judgment  normal.  Nursing note and vitals reviewed.   ED Course  Procedures (including critical care time) Labs Review Labs Reviewed  COMPREHENSIVE METABOLIC PANEL - Abnormal; Notable for the following:    Glucose, Bld 107 (*)    Total Protein 6.3 (*)    All other components within normal limits  CBC - Abnormal; Notable for the following:    Hemoglobin 11.7 (*)    MCH 25.7 (*)    All other components within normal limits  LIPASE, BLOOD  URINALYSIS, ROUTINE W REFLEX MICROSCOPIC (NOT AT [facility])  POC URINE PREG, ED    Imaging Review US Abdomen Limited Ruq  11/15/2015  CLINICAL DATA:  Right upper abdominal pain since Saturday EXAM: US ABDOMEN LIMITED - RIGHT UPPER QUADRANT COMPARISON:  09/14/2014 FINDINGS: Gallbladder: Innumerable subcentimeter stones layering in the gallbladder lumen. 7 mm calculus in the gallbladder neck. No gallbladder wall thickening. No pericholecystic fluid. Sonographer reports no sonographic Murphy's sign. Common bile duct: Diameter: 6.2 mm Liver: No focal lesion identified. Within normal limits in parenchymal echogenicity. No intrahepatic biliary ductal dilatation. IMPRESSION: 1. Cholelithiasis without other ultrasound evidence of cholecystitis or biliary obstruction. Electronically Signed   By: Lucrezia Europe M.D.   On: 11/15/2015 11:15   I have personally reviewed and evaluated these lab results as part of my medical decision-making.   EKG Interpretation None     Medications  ondansetron (ZOFRAN) injection 4 mg (not administered)  morphine 4 MG/ML injection 4 mg (not administered)    MDM   Final diagnoses:  None   Pt w/ Several months of right upper quadrant pain, worse recently, and known diagnosis of gallstones on recent ultrasound who presents with severe, constant pain since 5 AM. On exam she was uncomfortable. Vital signs unremarkable. Right upper quadrant and midepigastric tenderness noted. Obtained above lab work as well as abdominal ultrasound to evaluate for  cholecystitis. Gave the patient morphine x 2 and Zofran.  Labwork unremarkable. Ultrasound shows cholelithiasis with stone in the gallbladder neck but no evidence of cholecystitis or obstruction. Because the patient presents after having failed outpatient management with Percocet, I contacted general surgery and discussed with Emina Riebock. Pt admitted for cholecystectomy.  Sharlett Iles, MD 11/15/15 [phone]

## 2015-11-15 NOTE — Progress Notes (Signed)
Surgery consent obtained from patient. Consent form placed in patient's chart.

## 2015-11-15 NOTE — ED Notes (Signed)
Patient here with ongoing abdominal pain since Monday. Seen at primary MD Monday and diagnosed with gallstones following u/s. Here with recurrent pain, nausea and vomiting

## 2015-11-15 NOTE — Progress Notes (Signed)
Pt admitted from ED this pm for surgery tomorrow. Alert, oriented and able to voice needs. Medicated for 8/10 abdominal pain. Pt says she has not taken any of her daily meds today. Scheduled lovenox not administered as pt having surgery tomorrow, to discuss with MD need for lovenox

## 2015-11-15 NOTE — H&P (Signed)
Chief Complaint: abdominal pain HPI: Brianna Stokes is a 30 year old female with a history of bipolar disorder, ASD/VSD, OCD, anxiety who presents with abdominal pain.  This episode started on Saturday.  assocaited with nausea, vomiting, chills and anorexia.  She was been by her pcp on Monday and apparently had a [place] which showed gallstones.  She was started on hydrocodone, no referral to surgery.  Her symptoms persisted and she presented to the ED this AM.  Last episode of emesis was on Monday.  No aggravating factors.  No alleviating factors.  Modifying factors include; tylenol, hydrocodone, ginger tea.  Has been able to hold liquids.  Reports a history of abdominal pain for many years which would wax and wane.   ED works up shows, normal LFTs and white count.  US revealed a stone in the neck of the gallbladder.  We have therefore been asked to evaluate.    Past Medical History  Diagnosis Date  . Heart murmur   . Vaginal delivery   . Anxiety   . Bipolar 1 disorder ([place])   . OCD (obsessive compulsive disorder)   . Obesity   . Hyperlipidemia     mixed  . Vitamin D deficiency   . SOB (shortness of breath)   . Fatigue   . Insulin resistance   . Gallstones     History reviewed. No pertinent past surgical history.  Family History  Problem Relation Age of Onset  . Depression Mother   . Diabetes Mellitus I Mother    Social History:  reports that she has quit smoking. She does not have any smokeless tobacco history on file. She reports that she drinks alcohol. She reports that she uses illicit drugs (Marijuana).  Allergies: No Known Allergies   (Not in a hospital admission)  Results for orders placed or performed during the hospital encounter of 11/15/15 (from the past 48 hour(s))  Lipase, blood     Status: None   Collection Time: 11/15/15  8:28 AM  Result Value Ref Range   Lipase 36 11 - 51 U/L  Comprehensive metabolic panel     Status: Abnormal   Collection Time: 11/15/15  8:28  AM  Result Value Ref Range   Sodium 143 135 - 145 mmol/L   Potassium 4.1 3.5 - 5.1 mmol/L   Chloride 108 101 - 111 mmol/L   CO2 24 22 - 32 mmol/L   Glucose, Bld 107 (H) 65 - 99 mg/dL   BUN 17 6 - 20 mg/dL   Creatinine, Ser 1.00 0.44 - 1.00 mg/dL   Calcium 9.0 8.9 - 10.3 mg/dL   Total Protein 6.3 (L) 6.5 - 8.1 g/dL   Albumin 3.5 3.5 - 5.0 g/dL   AST 18 15 - 41 U/L   ALT 21 14 - 54 U/L   Alkaline Phosphatase 60 38 - 126 U/L   Total Bilirubin 0.4 0.3 - 1.2 mg/dL   GFR calc non Af Amer >60 >60 mL/min   GFR calc Af Amer >60 >60 mL/min    Comment: (NOTE) The eGFR has been calculated using the CKD EPI equation. This calculation has not been validated in all clinical situations. eGFR's persistently <60 mL/min signify possible Chronic Kidney Disease.    Anion gap 11 5 - 15  CBC     Status: Abnormal   Collection Time: 11/15/15  8:28 AM  Result Value Ref Range   WBC 10.2 4.0 - 10.5 K/uL   RBC 4.56 3.87 - 5.11 MIL/uL  Hemoglobin 11.7 (L) 12.0 - 15.0 g/dL   HCT 37.6 36.0 - 46.0 %   MCV 82.5 78.0 - 100.0 fL   MCH 25.7 (L) 26.0 - 34.0 pg   MCHC 31.1 30.0 - 36.0 g/dL   RDW 14.1 11.5 - 15.5 %   Platelets 278 150 - 400 K/uL  Urinalysis, Routine w reflex microscopic (not at [facility])     Status: None   Collection Time: 11/15/15  9:35 AM  Result Value Ref Range   Color, Urine YELLOW YELLOW   APPearance CLEAR CLEAR   Specific Gravity, Urine 1.015 1.005 - 1.030   pH 5.0 5.0 - 8.0   Glucose, UA NEGATIVE NEGATIVE mg/dL   Hgb urine dipstick NEGATIVE NEGATIVE   Bilirubin Urine NEGATIVE NEGATIVE   Ketones, ur NEGATIVE NEGATIVE mg/dL   Protein, ur NEGATIVE NEGATIVE mg/dL   Nitrite NEGATIVE NEGATIVE   Leukocytes, UA NEGATIVE NEGATIVE    Comment: MICROSCOPIC NOT DONE ON URINES WITH NEGATIVE PROTEIN, BLOOD, LEUKOCYTES, NITRITE, OR GLUCOSE <1000 mg/dL.  POC urine preg, ED     Status: None   Collection Time: 11/15/15  9:57 AM  Result Value Ref Range   Preg Test, Ur NEGATIVE NEGATIVE    Comment:         THE SENSITIVITY OF THIS METHODOLOGY IS >24 mIU/mL    US Abdomen Limited Ruq  11/15/2015  CLINICAL DATA:  Right upper abdominal pain since Saturday EXAM: US ABDOMEN LIMITED - RIGHT UPPER QUADRANT COMPARISON:  09/14/2014 FINDINGS: Gallbladder: Innumerable subcentimeter stones layering in the gallbladder lumen. 7 mm calculus in the gallbladder neck. No gallbladder wall thickening. No pericholecystic fluid. Sonographer reports no sonographic Murphy's sign. Common bile duct: Diameter: 6.2 mm Liver: No focal lesion identified. Within normal limits in parenchymal echogenicity. No intrahepatic biliary ductal dilatation. IMPRESSION: 1. Cholelithiasis without other ultrasound evidence of cholecystitis or biliary obstruction. Electronically Signed   By: Lucrezia Europe M.D.   On: 11/15/2015 11:15    Review of Systems  Constitutional: Positive for chills, weight loss and malaise/fatigue. Negative for fever and diaphoresis.  Eyes: Negative for blurred vision, double vision, photophobia, pain, discharge and redness.  Respiratory: Negative for cough, hemoptysis, sputum production, shortness of breath and wheezing.   Cardiovascular: Negative for chest pain, palpitations, orthopnea, claudication, leg swelling and PND.  Gastrointestinal: Positive for nausea, vomiting and abdominal pain. Negative for diarrhea, constipation, blood in stool and melena.  Genitourinary: Negative for dysuria, urgency, frequency, hematuria and flank pain.  Neurological: Negative for dizziness, tingling, tremors, sensory change, speech change, focal weakness, seizures, loss of consciousness, weakness and headaches.  Psychiatric/Behavioral: Negative for depression, suicidal ideas, hallucinations, memory loss and substance abuse. The patient is not nervous/anxious and does not have insomnia.     Blood pressure 104/68, pulse 72, temperature 97.5 F (36.4 C), temperature source Oral, resp. rate 20, height 5' 8"  (1.727 m), weight 86.183 kg  (190 lb), SpO2 93 %. Physical Exam  Constitutional: She is oriented to person, place, and time. She appears well-developed and well-nourished. No distress.  Cardiovascular: Normal rate, regular rhythm, normal heart sounds and intact distal pulses.  Exam reveals no gallop and no friction rub.   No murmur heard. Respiratory: Effort normal and breath sounds normal. No respiratory distress. She has no wheezes. She has no rales. She exhibits no tenderness.  GI: Soft. Bowel sounds are normal. She exhibits no distension and no mass. There is no rebound and no guarding.  ruq tenderness  Musculoskeletal: Normal range of motion. She exhibits  no edema or tenderness.  Neurological: She is alert and oriented to person, place, and time.  Skin: Skin is warm and dry. No rash noted. She is not diaphoretic. No erythema. No pallor.  Psychiatric: She has a normal mood and affect. Her behavior is normal. Thought content normal.     Assessment/Plan Symptomatic cholelithiasis -admit for IV antibiotics, pain control and cholecystectomy in AM.  Surgical risks discussed including infection, bleeding, injury to surrounding structures, open surgery, heart attack, DVT/PE, respiratory compromise.  The patient verbalizes understanding and wishes to proceed. ID-rocephin FEN-clears, NPO p MN, IVF, pain control Psych-continue home meds Dispo-to floor   Erby Pian, NP  Pager [phone] 11/15/2015, 12:45 PM

## 2015-11-16 ENCOUNTER — Inpatient Hospital Stay (HOSPITAL_COMMUNITY): Payer: Self-pay | Admitting: Certified Registered"

## 2015-11-16 ENCOUNTER — Encounter (HOSPITAL_COMMUNITY): Admission: EM | Disposition: A | Discharge status: Home / Self Care | Payer: Self-pay | Source: Home / Self Care

## 2015-11-16 ENCOUNTER — Encounter (HOSPITAL_COMMUNITY): Payer: Self-pay | Admitting: Certified Registered"

## 2015-11-16 HISTORY — PX: CHOLECYSTECTOMY: SHX55

## 2015-11-16 LAB — SURGICAL PCR SCREEN
MRSA, PCR: NEGATIVE
STAPHYLOCOCCUS AUREUS: NEGATIVE

## 2015-11-16 SURGERY — LAPAROSCOPIC CHOLECYSTECTOMY
Anesthesia: General | Site: Abdomen

## 2015-11-16 MED ORDER — LIDOCAINE HCL (CARDIAC) 20 MG/ML IV SOLN
INTRAVENOUS | Status: AC
  Filled 2015-11-16: qty 10

## 2015-11-16 MED ORDER — HEMOSTATIC AGENTS (NO CHARGE) OPTIME
TOPICAL | Status: DC | PRN
  Administered 2015-11-16: 1 via TOPICAL

## 2015-11-16 MED ORDER — DEXAMETHASONE SODIUM PHOSPHATE 4 MG/ML IJ SOLN
INTRAMUSCULAR | Status: AC
  Filled 2015-11-16: qty 1

## 2015-11-16 MED ORDER — HYDROMORPHONE HCL 1 MG/ML IJ SOLN
INTRAMUSCULAR | Status: AC
  Administered 2015-11-16: 0.5 mg via INTRAVENOUS
  Filled 2015-11-16: qty 1

## 2015-11-16 MED ORDER — PROPOFOL 10 MG/ML IV BOLUS
INTRAVENOUS | Status: AC
  Filled 2015-11-16: qty 20

## 2015-11-16 MED ORDER — GLYCOPYRROLATE 0.2 MG/ML IJ SOLN
INTRAMUSCULAR | Status: DC | PRN
  Administered 2015-11-16: 0.4 mg via INTRAVENOUS
  Administered 2015-11-16: 0.2 mg via INTRAVENOUS

## 2015-11-16 MED ORDER — ONDANSETRON HCL 4 MG/2ML IJ SOLN
INTRAMUSCULAR | Status: DC | PRN
  Administered 2015-11-16: 4 mg via INTRAVENOUS

## 2015-11-16 MED ORDER — 0.9 % SODIUM CHLORIDE (POUR BTL) OPTIME
TOPICAL | Status: DC | PRN
  Administered 2015-11-16: 1000 mL

## 2015-11-16 MED ORDER — ONDANSETRON HCL 4 MG/2ML IJ SOLN
INTRAMUSCULAR | Status: AC
  Filled 2015-11-16: qty 2

## 2015-11-16 MED ORDER — SODIUM CHLORIDE 0.9 % IR SOLN
Status: DC | PRN
  Administered 2015-11-16 (×2): 1000 mL

## 2015-11-16 MED ORDER — BUPIVACAINE-EPINEPHRINE 0.25% -1:200000 IJ SOLN
INTRAMUSCULAR | Status: DC | PRN
  Administered 2015-11-16: 16 mL

## 2015-11-16 MED ORDER — OXYCODONE-ACETAMINOPHEN 5-325 MG PO TABS
1.0000 | ORAL_TABLET | ORAL | Status: DC | PRN
  Administered 2015-11-16 – 2015-11-17 (×4): 2 via ORAL
  Filled 2015-11-16 (×4): qty 2

## 2015-11-16 MED ORDER — DEXAMETHASONE SODIUM PHOSPHATE 4 MG/ML IJ SOLN
INTRAMUSCULAR | Status: DC | PRN
  Administered 2015-11-16: 8 mg via INTRAVENOUS

## 2015-11-16 MED ORDER — PROPOFOL 10 MG/ML IV BOLUS
INTRAVENOUS | Status: DC | PRN
  Administered 2015-11-16: 180 mg via INTRAVENOUS

## 2015-11-16 MED ORDER — HYDROMORPHONE HCL 1 MG/ML IJ SOLN
0.5000 mg | INTRAMUSCULAR | Status: AC | PRN
  Administered 2015-11-16 (×4): 0.5 mg via INTRAVENOUS

## 2015-11-16 MED ORDER — DEXAMETHASONE SODIUM PHOSPHATE 4 MG/ML IJ SOLN
INTRAMUSCULAR | Status: AC
  Filled 2015-11-16: qty 2

## 2015-11-16 MED ORDER — MIDAZOLAM HCL 2 MG/2ML IJ SOLN
INTRAMUSCULAR | Status: AC
  Filled 2015-11-16: qty 2

## 2015-11-16 MED ORDER — ROCURONIUM BROMIDE 100 MG/10ML IV SOLN
INTRAVENOUS | Status: DC | PRN
  Administered 2015-11-16 (×2): 20 mg via INTRAVENOUS

## 2015-11-16 MED ORDER — BUPIVACAINE-EPINEPHRINE (PF) 0.25% -1:200000 IJ SOLN
INTRAMUSCULAR | Status: AC
  Filled 2015-11-16: qty 30

## 2015-11-16 MED ORDER — NEOSTIGMINE METHYLSULFATE 10 MG/10ML IV SOLN
INTRAVENOUS | Status: DC | PRN
  Administered 2015-11-16: 3 mg via INTRAVENOUS

## 2015-11-16 MED ORDER — PHENYLEPHRINE HCL 10 MG/ML IJ SOLN
INTRAMUSCULAR | Status: DC | PRN
  Administered 2015-11-16 (×4): 80 ug via INTRAVENOUS

## 2015-11-16 MED ORDER — ROCURONIUM BROMIDE 50 MG/5ML IV SOLN
INTRAVENOUS | Status: AC
Start: 2015-11-16 — End: 2015-11-16
  Filled 2015-11-16: qty 1

## 2015-11-16 MED ORDER — CEFTRIAXONE SODIUM 2 G IJ SOLR
2.0000 g | Freq: Once | INTRAMUSCULAR | Status: AC
  Administered 2015-11-16: 2 g via INTRAVENOUS
  Filled 2015-11-16: qty 2

## 2015-11-16 MED ORDER — ONDANSETRON HCL 4 MG/2ML IJ SOLN
4.0000 mg | Freq: Once | INTRAMUSCULAR | Status: AC | PRN
  Administered 2015-11-16: 4 mg via INTRAVENOUS

## 2015-11-16 MED ORDER — MIDAZOLAM HCL 5 MG/5ML IJ SOLN
INTRAMUSCULAR | Status: DC | PRN
  Administered 2015-11-16: 2 mg via INTRAVENOUS

## 2015-11-16 MED ORDER — GLYCOPYRROLATE 0.2 MG/ML IJ SOLN
INTRAMUSCULAR | Status: AC
  Filled 2015-11-16: qty 2

## 2015-11-16 MED ORDER — LIDOCAINE HCL (CARDIAC) 20 MG/ML IV SOLN
INTRAVENOUS | Status: DC | PRN
  Administered 2015-11-16: 60 mg via INTRAVENOUS
  Administered 2015-11-16: 60 mg via INTRATRACHEAL

## 2015-11-16 MED ORDER — FENTANYL CITRATE (PF) 250 MCG/5ML IJ SOLN
INTRAMUSCULAR | Status: AC
  Filled 2015-11-16: qty 5

## 2015-11-16 MED ORDER — FENTANYL CITRATE (PF) 250 MCG/5ML IJ SOLN
INTRAMUSCULAR | Status: DC | PRN
  Administered 2015-11-16: 50 ug via INTRAVENOUS
  Administered 2015-11-16 (×2): 100 ug via INTRAVENOUS

## 2015-11-16 SURGICAL SUPPLY — 43 items
APPLIER CLIP 5 13 M/L LIGAMAX5 (MISCELLANEOUS) ×2
BLADE SURG ROTATE 9660 (MISCELLANEOUS) IMPLANT
CANISTER SUCTION 2500CC (MISCELLANEOUS) ×2 IMPLANT
CHLORAPREP W/TINT 26ML (MISCELLANEOUS) ×2 IMPLANT
CLIP APPLIE 5 13 M/L LIGAMAX5 (MISCELLANEOUS) ×1 IMPLANT
COVER SURGICAL LIGHT HANDLE (MISCELLANEOUS) ×2 IMPLANT
DEVICE TROCAR PUNCTURE CLOSURE (ENDOMECHANICALS) ×2 IMPLANT
ELECT REM PT RETURN 9FT ADLT (ELECTROSURGICAL) ×2
ELECTRODE REM PT RTRN 9FT ADLT (ELECTROSURGICAL) ×1 IMPLANT
GLOVE BIO SURGEON STRL SZ7 (GLOVE) ×2 IMPLANT
GLOVE BIOGEL PI IND STRL 6.5 (GLOVE) ×1 IMPLANT
GLOVE BIOGEL PI IND STRL 7.0 (GLOVE) ×1 IMPLANT
GLOVE BIOGEL PI IND STRL 7.5 (GLOVE) ×2 IMPLANT
GLOVE BIOGEL PI INDICATOR 6.5 (GLOVE) ×1
GLOVE BIOGEL PI INDICATOR 7.0 (GLOVE) ×1
GLOVE BIOGEL PI INDICATOR 7.5 (GLOVE) ×2
GLOVE ECLIPSE 7.0 STRL STRAW (GLOVE) ×2 IMPLANT
GLOVE SURG SS PI 6.5 STRL IVOR (GLOVE) ×2 IMPLANT
GLOVE SURG SS PI 7.0 STRL IVOR (GLOVE) ×2 IMPLANT
GOWN STRL REUS W/ TWL LRG LVL3 (GOWN DISPOSABLE) ×3 IMPLANT
GOWN STRL REUS W/TWL LRG LVL3 (GOWN DISPOSABLE) ×3
HEMOSTAT SNOW SURGICEL 2X4 (HEMOSTASIS) ×2 IMPLANT
KIT BASIN OR (CUSTOM PROCEDURE TRAY) ×2 IMPLANT
KIT ROOM TURNOVER OR (KITS) ×2 IMPLANT
LIQUID BAND (GAUZE/BANDAGES/DRESSINGS) ×2 IMPLANT
NS IRRIG 1000ML POUR BTL (IV SOLUTION) ×2 IMPLANT
PAD ARMBOARD 7.5X6 YLW CONV (MISCELLANEOUS) ×2 IMPLANT
POUCH RETRIEVAL ECOSAC 10 (ENDOMECHANICALS) ×1 IMPLANT
POUCH RETRIEVAL ECOSAC 10MM (ENDOMECHANICALS) ×1
SCISSORS LAP 5X35 DISP (ENDOMECHANICALS) ×2 IMPLANT
SET IRRIG TUBING LAPAROSCOPIC (IRRIGATION / IRRIGATOR) ×2 IMPLANT
SLEEVE ENDOPATH XCEL 5M (ENDOMECHANICALS) ×4 IMPLANT
SPECIMEN JAR SMALL (MISCELLANEOUS) ×2 IMPLANT
STRIP CLOSURE SKIN 1/2X4 (GAUZE/BANDAGES/DRESSINGS) ×2 IMPLANT
SUT MNCRL AB 4-0 PS2 18 (SUTURE) ×4 IMPLANT
SUT VICRYL 0 UR6 27IN ABS (SUTURE) ×2 IMPLANT
TOWEL OR 17X24 6PK STRL BLUE (TOWEL DISPOSABLE) ×2 IMPLANT
TOWEL OR 17X26 10 PK STRL BLUE (TOWEL DISPOSABLE) ×2 IMPLANT
TRAY LAPAROSCOPIC MC (CUSTOM PROCEDURE TRAY) ×2 IMPLANT
TROCAR XCEL BLUNT TIP 100MML (ENDOMECHANICALS) ×2 IMPLANT
TROCAR XCEL NON-BLD 11X100MML (ENDOMECHANICALS) ×2 IMPLANT
TROCAR XCEL NON-BLD 5MMX100MML (ENDOMECHANICALS) ×2 IMPLANT
TUBING INSUFFLATION (TUBING) ×2 IMPLANT

## 2015-11-16 NOTE — Op Note (Signed)
Preoperative diagnosis: acute cholecystitis Postoperative diagnosis: saa Procedure: laparoscopic cholecystectomy Surgeon: Dr Serita Grammes Anesthesia: general EBL: minimal Drains none Specimen gb and contents to pathology Complications: none Sponge count correct at completion Disposition to recovery stable  Indications: This is a 65 yof who has acute cholecystitis by exam. She has innumerable stones on [place].  We discussed laparoscopic cholecystectomy.   Procedure: After informed consent was obtained the patient was taken to the operating room. She was given antibiotics. Sequential compression devices were on her legs. She was placed under general anesthesia without complication. Her abdomen was prepped and draped in the standard sterile surgical fashion. A surgical timeout was then performed.  I infiltrated marcaine below the umbilicus. I made an incision and then entered the fascia sharply. I then entered the peritoneum bluntly. There was no evidence of an entry injury. I placed a 0 vicryl pursestring suture and inserted a hasson trocar. I then inserted 3 further 5 mm trocars in the epigastrium and ruq.I first had to decompress a very distended gallbladder to do the surgery. There was some spillage of very small stones when I did this. I was able to grasp the gallbladder and retract it cephalad and lateral. I then obtained the critical view of safety. I was able to identify the duct and place three clips proximally and one distally. The duct was divided. The duct was viable and the clips traversed the duct. I then treated the cystic artery the same. the cystic artery was very close to the right hepatic artery.   I then removed the gallbladder from the liver bed and placed it in a bag. I removed the gallbladder from the umbilical incision. I then obtained hemostasis and irrigated. i evacuated all the small stones after sizing the epigastric trocar up to an 11 mm trocar. I did place  surgicel snow in the gb fossa as well. I then removed the umbilical trocar and closed with 0 vicryl and the endoclose device after tying down the pursestring.  I then desufflated the abdomen and removed all my remaining trocars. I then closed these with 4-0 Monocryl and Dermabond. She tolerated this well was extubated and transferred to the recovery room in stable condition

## 2015-11-16 NOTE — Anesthesia Preprocedure Evaluation (Addendum)
Anesthesia Evaluation  Patient identified by MRN, date of birth, ID band Patient awake    Reviewed: Allergy & Precautions, NPO status , Patient's Chart, lab work & pertinent test results  Airway Mallampati: I  TM Distance: >3 FB Neck ROM: Full    Dental   Pulmonary shortness of breath, former smoker,    Pulmonary exam normal        Cardiovascular Normal cardiovascular exam+ Valvular Problems/Murmurs      Neuro/Psych    GI/Hepatic   Endo/Other    Renal/GU      Musculoskeletal   Abdominal   Peds  Hematology   Anesthesia Other Findings ASD  Reproductive/Obstetrics                            Anesthesia Physical Anesthesia Plan  ASA: II  Anesthesia Plan: General   Post-op Pain Management:    Induction: Intravenous  Airway Management Planned: Oral ETT  Additional Equipment:   Intra-op Plan:   Post-operative Plan: Extubation in OR  Informed Consent: I have reviewed the patients History and Physical, chart, labs and discussed the procedure including the risks, benefits and alternatives for the proposed anesthesia with the patient or authorized representative who has indicated his/her understanding and acceptance.     Plan Discussed with: CRNA, Anesthesiologist and Surgeon  Anesthesia Plan Comments:         Anesthesia Quick Evaluation

## 2015-11-16 NOTE — Anesthesia Postprocedure Evaluation (Signed)
Anesthesia Post Note  Patient: Brianna Stokes  Procedure(s) Performed: Procedure(s) (LRB): LAPAROSCOPIC CHOLECYSTECTOMY (N/A)  Patient location during evaluation: PACU Anesthesia Type: General Level of consciousness: awake, awake and alert, oriented and patient cooperative Pain management: pain level controlled Vital Signs Assessment: post-procedure vital signs reviewed and stable Respiratory status: spontaneous breathing and respiratory function stable Cardiovascular status: blood pressure returned to baseline and stable Anesthetic complications: no    Last Vitals:  Filed Vitals:   11/16/15 1045 11/16/15 1055  BP:  104/67  Pulse: 60 62  Temp:    Resp: 17 11    Last Pain:  Filed Vitals:   11/16/15 1100  PainSc: 10-Worst pain ever                 Trulee Hamstra EDWARD

## 2015-11-16 NOTE — Transfer of Care (Signed)
Immediate Anesthesia Transfer of Care Note  Patient: Brianna Stokes  Procedure(s) Performed: Procedure(s): LAPAROSCOPIC CHOLECYSTECTOMY (N/A)  Patient Location: PACU  Anesthesia Type:General  Level of Consciousness: awake, alert , oriented and patient cooperative  Airway & Oxygen Therapy: Patient Spontanous Breathing and Patient connected to nasal cannula oxygen  Post-op Assessment: Report given to RN, Post -op Vital signs reviewed and stable and Patient moving all extremities  Post vital signs: Reviewed and stable  Last Vitals:  Filed Vitals:   11/15/15 2145 11/16/15 0638  BP: 110/65 96/61  Pulse: 78 76  Temp: 36.7 C 36.1 C  Resp: 20 20    Complications: No apparent anesthesia complications

## 2015-11-16 NOTE — Anesthesia Procedure Notes (Signed)
Procedure Name: Intubation Date/Time: 11/16/2015 9:07 AM Performed by: Myna Bright Pre-anesthesia Checklist: Emergency Drugs available, Patient identified, Suction available and Patient being monitored Patient Re-evaluated:Patient Re-evaluated prior to inductionOxygen Delivery Method: Circle system utilized Preoxygenation: Pre-oxygenation with 100% oxygen Intubation Type: IV induction Ventilation: Mask ventilation without difficulty Laryngoscope Size: Mac and 3 Grade View: Grade I Tube type: Oral Number of attempts: 1 Airway Equipment and Method: Stylet and LTA kit utilized Placement Confirmation: ETT inserted through vocal cords under direct vision,  positive ETCO2 and breath sounds checked- equal and bilateral Secured at: 21 cm Tube secured with: Tape Dental Injury: Teeth and Oropharynx as per pre-operative assessment

## 2015-11-16 NOTE — Progress Notes (Signed)
Pt returned to unit s/p lap chole with 4 lap sites secured with dermabond and steri strips

## 2015-11-17 ENCOUNTER — Encounter (HOSPITAL_COMMUNITY): Payer: Self-pay | Admitting: General Surgery

## 2015-11-17 MED ORDER — OXYCODONE-ACETAMINOPHEN 5-325 MG PO TABS: 1.0000 | ORAL_TABLET | ORAL | Status: DC | PRN

## 2015-11-17 NOTE — Progress Notes (Signed)
Pt for discharge home. IV removed. Pt. Is alert and oriented. Pt is hemodynamically stable. AVS reviewed with pt. Capable of re verbalizing medication regimen. Discharge plan appropriate and in place.

## 2015-11-17 NOTE — Discharge Summary (Signed)
Physician Discharge Summary  Patient ID: Brianna Stokes MRN: [id number] DOB/AGE: [birth year] 30 y.o.  Admit date: 11/15/2015 Discharge date: 11/17/2015  Admission Diagnoses: Cholecystitis  Discharge Diagnoses:  Active Problems:   Symptomatic cholelithiasis   Discharged Condition: good  Hospital Course: 55 yof admitted and underwent uneventful lap chole. She is ready for discharge  Consults: None  Significant Diagnostic Studies: [place] with gallstones  Treatments: surgery: lap chole  Discharge Exam: Blood pressure 117/69, pulse 82, temperature 98.7 F (37.1 C), temperature source Oral, resp. rate 19, height 5\' 8"  (1.727 m), weight 86.183 kg (190 lb), SpO2 100 %. GI: soft nd incision clean  Disposition: 01-Home or Self Care     Medication List    TAKE these medications        ergocalciferol 50000 units capsule  Commonly known as:  VITAMIN D2  Take 50,000 Units by mouth once a week.     hydrOXYzine 10 MG tablet  Commonly known as:  ATARAX/VISTARIL  Take 10 mg by mouth 3 (three) times daily as needed for anxiety (anxiety).     lamoTRIgine 25 MG tablet  Commonly known as:  LAMICTAL  Take 75 mg by mouth 2 (two) times daily.     omeprazole 20 MG capsule  Commonly known as:  PRILOSEC  Take 1 capsule (20 mg total) by mouth daily.     oxyCODONE-acetaminophen 5-325 MG tablet  Commonly known as:  PERCOCET/ROXICET  Take 1-2 tablets by mouth every 4 (four) hours as needed for moderate pain.     SAXENDA 18 MG/3ML Sopn  Generic drug:  Liraglutide -Weight Management  Inject 18 mg into the skin daily.     sertraline 100 MG tablet  Commonly known as:  ZOLOFT  Take 100 mg by mouth daily.     TROKENDI XR 50 MG Cp24  Generic drug:  Topiramate ER  Take 50 mg by mouth daily.         SignedRolm Bookbinder 11/17/2015, 8:26 AM

## 2015-11-17 NOTE — Discharge Instructions (Signed)
[facility] LAPAROSCOPIC SURGERY: POST OP INSTRUCTIONS  Always review your discharge instruction sheet given to you by the facility where your surgery was performed. IF YOU HAVE DISABILITY OR FAMILY LEAVE FORMS, YOU MUST BRING THEM TO THE OFFICE FOR PROCESSING.   DO NOT GIVE THEM TO YOUR DOCTOR.  1. A prescription for pain medication may be given to you upon discharge.  Take your pain medication as prescribed, if needed.  If narcotic pain medicine is not needed, then you may take acetaminophen (Tylenol), naprosyn (Alleve), or ibuprofen (Advil) as needed. 2. Take your usually prescribed medications unless otherwise directed. 3. If you need a refill on your pain medication, please contact your pharmacy.  They will contact our office to request authorization. Prescriptions will not be filled after 5pm or on week-ends. 4. You should follow a light diet the first few days after arrival home, such as soup and crackers, etc.  Be sure to include lots of fluids daily. 5. Most patients will experience some swelling and bruising in the area of the incisions.  Ice packs will help.  Swelling and bruising can take several days to resolve.  6. It is common to experience some constipation if taking pain medication after surgery.  Increasing fluid intake and taking a stool softener (such as Colace) will usually help or prevent this problem from occurring.  A mild laxative (Milk of Magnesia or Miralax) should be taken according to package instructions if there are no bowel movements after 48 hours. 7. Unless discharge instructions indicate otherwise, you may remove your bandages 48 hours after surgery, and you may shower at that time.  You may have steri-strips (small skin tapes) in place directly over the incision.  These strips should be left on the skin for 7-10 days.  If your surgeon used skin glue on the incision, you may shower in 24 hours.  The glue will flake  off over the next 2-3 weeks.  Any sutures or staples will be removed at the office during your follow-up visit. 8. ACTIVITIES:  You may resume regular (light) daily activities beginning the next day--such as daily self-care, walking, climbing stairs--gradually increasing activities as tolerated.  You may have sexual intercourse when it is comfortable.  Refrain from any heavy lifting or straining until approved by your doctor. a. You may drive when you are no longer taking prescription pain medication, you can comfortably wear a seatbelt, and you can safely maneuver your car and apply brakes. b. RETURN TO WORK:  __________________________________________________________ 9. You should see your doctor in the office for a follow-up appointment approximately 2-3 weeks after your surgery.  Make sure that you call for this appointment within a day or two after you arrive home to insure a convenient appointment time. 10. OTHER INSTRUCTIONS: __________________________________________________________________________________________________________________________ __________________________________________________________________________________________________________________________ WHEN TO CALL YOUR DOCTOR: 1. Fever over 101.0 2. Inability to urinate 3. Continued bleeding from incision. 4. Increased pain, redness, or drainage from the incision. 5. Increasing abdominal pain  The clinic staff is available to answer your questions during regular business hours.  Please don't hesitate to call and ask to speak to one of the nurses for clinical concerns.  If you have a medical emergency, go to the nearest emergency room or call 911.  A surgeon from [facility] is always on call at the hospital. [address], [address], [place], [place]  [address] ? [address] [address], [place], [place]   [address] [phone] ? [phone] ? [phone] Web site: [email or web address]  

## 2016-08-10 ENCOUNTER — Emergency Department (HOSPITAL_COMMUNITY)
Admission: EM | Admit: 2016-08-10 | Discharge: 2016-08-11 | Disposition: A | Discharge status: Home / Self Care | Payer: Medicaid Other | Attending: Emergency Medicine | Admitting: Emergency Medicine

## 2016-08-10 ENCOUNTER — Emergency Department (HOSPITAL_COMMUNITY): Payer: Medicaid Other

## 2016-08-10 ENCOUNTER — Encounter (HOSPITAL_COMMUNITY): Payer: Self-pay

## 2016-08-10 DIAGNOSIS — N83201 Unspecified ovarian cyst, right side: Secondary | ICD-10-CM | POA: Diagnosis not present

## 2016-08-10 DIAGNOSIS — R109 Unspecified abdominal pain: Secondary | ICD-10-CM

## 2016-08-10 DIAGNOSIS — Z87891 Personal history of nicotine dependence: Secondary | ICD-10-CM | POA: Insufficient documentation

## 2016-08-10 DIAGNOSIS — O3481 Maternal care for other abnormalities of pelvic organs, first trimester: Secondary | ICD-10-CM | POA: Insufficient documentation

## 2016-08-10 DIAGNOSIS — Z3A01 Less than 8 weeks gestation of pregnancy: Secondary | ICD-10-CM | POA: Insufficient documentation

## 2016-08-10 DIAGNOSIS — R059 Cough, unspecified: Secondary | ICD-10-CM

## 2016-08-10 DIAGNOSIS — O26891 Other specified pregnancy related conditions, first trimester: Secondary | ICD-10-CM | POA: Diagnosis present

## 2016-08-10 DIAGNOSIS — D259 Leiomyoma of uterus, unspecified: Secondary | ICD-10-CM

## 2016-08-10 DIAGNOSIS — Z3490 Encounter for supervision of normal pregnancy, unspecified, unspecified trimester: Secondary | ICD-10-CM

## 2016-08-10 DIAGNOSIS — O0281 Inappropriate change in quantitative human chorionic gonadotropin (hCG) in early pregnancy: Secondary | ICD-10-CM | POA: Diagnosis not present

## 2016-08-10 DIAGNOSIS — R05 Cough: Secondary | ICD-10-CM | POA: Diagnosis not present

## 2016-08-10 LAB — I-STAT BETA HCG BLOOD, ED (MC, WL, AP ONLY)

## 2016-08-10 LAB — BASIC METABOLIC PANEL
Anion gap: 4 — ABNORMAL LOW (ref 5–15)
BUN: 12 mg/dL (ref 6–20)
CHLORIDE: 110 mmol/L (ref 101–111)
CO2: 23 mmol/L (ref 22–32)
CREATININE: 0.71 mg/dL (ref 0.44–1.00)
Calcium: 8.8 mg/dL — ABNORMAL LOW (ref 8.9–10.3)
GFR calc Af Amer: >60 mL/min (ref >60)
GFR calc non Af Amer: >60 mL/min (ref >60)
GLUCOSE: 104 mg/dL — AB (ref 65–99)
Potassium: 4 mmol/L (ref 3.5–5.1)
SODIUM: 137 mmol/L (ref 135–145)

## 2016-08-10 LAB — CBC
HEMATOCRIT: 36.4 % (ref 36.0–46.0)
Hemoglobin: 12.3 g/dL (ref 12.0–15.0)
MCH: 26.7 pg (ref 26.0–34.0)
MCHC: 33.8 g/dL (ref 30.0–36.0)
MCV: 79 fL (ref 78.0–100.0)
Platelets: 296 10*3/uL (ref 150–400)
RBC: 4.61 MIL/uL (ref 3.87–5.11)
RDW: 13.3 % (ref 11.5–15.5)
WBC: 8.4 10*3/uL (ref 4.0–10.5)

## 2016-08-10 LAB — URINALYSIS, ROUTINE W REFLEX MICROSCOPIC
BILIRUBIN URINE: NEGATIVE
GLUCOSE, UA: NEGATIVE mg/dL
Hgb urine dipstick: NEGATIVE
KETONES UR: NEGATIVE mg/dL
Nitrite: NEGATIVE
PH: 5 (ref 5.0–8.0)
Protein, ur: NEGATIVE mg/dL
SPECIFIC GRAVITY, URINE: 1.028 (ref 1.005–1.030)

## 2016-08-10 LAB — HCG, QUANTITATIVE, PREGNANCY: hCG, Beta Chain, Quant, S: 5430 m[IU]/mL — ABNORMAL HIGH (ref <5)

## 2016-08-10 LAB — TROPONIN I: Troponin I: <0.03 ng/mL (ref <0.03)

## 2016-08-10 MED ORDER — SODIUM CHLORIDE 0.9 % IV BOLUS (SEPSIS)
1000.0000 mL | Freq: Once | INTRAVENOUS | Status: AC
  Administered 2016-08-10: 1000 mL via INTRAVENOUS

## 2016-08-10 MED ORDER — ACETAMINOPHEN 500 MG PO TABS
1000.0000 mg | ORAL_TABLET | Freq: Once | ORAL | Status: AC
  Administered 2016-08-10: 1000 mg via ORAL
  Filled 2016-08-10: qty 2

## 2016-08-10 NOTE — ED Provider Notes (Signed)
[facility] DEPT Provider Note   CSN: [id number] Arrival date & time: 08/10/16  1940     History   Chief Complaint Chief Complaint  Patient presents with  . Chest Pain    HPI Ciarra Welburn is a 30 y.o. 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 Patient reports that her husband was diagnosed with pneumonia last night. Patient reports her symptoms are worse at night. No treatments prior to arrival. Nothing makes her symptoms better or worse. Patient provides a paper copy of her last echo showing an ASD of less than 10 mm with primarily left to right shunt and a very small VSD; no evidence of pulmonary hypertension.  Patient is followed by cardiologist Arley Phenix at [facility].    Patient also reports that she took a pregnancy test last week and it was positive. She states she's been having left-sided low back and abdominal pain. She denies any vaginal discharge, vaginal bleeding, dysuria, hematuria. She denies known trauma or falls. She states no radiation of the pain. It is moderate and achy in nature. She denies vomiting or diarrhea. No sick contacts.  Patient reports a history of kidney stones but states this pain is completely different. No history of ectopic pregnancies.  The history is provided by the patient and medical records. No language interpreter was used.    Past Medical History:  Diagnosis Date  . Anxiety   . Bipolar 1 disorder ([place])   . Fatigue   . Gallstones   . Heart murmur   . Hyperlipidemia    mixed  . Insulin resistance   . Obesity   . OCD (obsessive compulsive disorder)   . SOB (shortness of breath)   . Vaginal delivery   . Vitamin D deficiency     Patient Active  Problem List   Diagnosis Date Noted  . Symptomatic cholelithiasis 11/15/2015    Past Surgical History:  Procedure Laterality Date  . CHOLECYSTECTOMY N/A 11/16/2015   Procedure: LAPAROSCOPIC CHOLECYSTECTOMY;  Surgeon: Rolm Bookbinder, MD;  Location: [facility] OR;  Service: General;  Laterality: N/A;    OB History    Gravida Para Term Preterm AB Living   1             SAB TAB Ectopic Multiple Live Births                   Home Medications    Prior to Admission medications   Medication Sig Start Date End Date Taking? Authorizing Provider  hydrOXYzine (ATARAX/VISTARIL) 10 MG tablet Take 10 mg by mouth 3 (three) times daily as needed for anxiety (anxiety).   Yes Historical Provider, MD  lamoTRIgine (LAMICTAL) 25 MG tablet Take 75 mg by mouth 2 (two) times daily.     Historical Provider, MD  Prenatal Vit-Fe Fumarate-FA (PRENATAL COMPLETE) 14-0.4 MG TABS Take 2 tablets by mouth daily. 08/11/16   Warda Mcqueary, PA-C  sertraline (ZOLOFT) 100 MG tablet Take 100 mg by mouth daily.    Historical Provider, MD    Family History Family History  Problem Relation Age of Onset  . Depression Mother   . Diabetes Mellitus I Mother     Social History Social History  Substance Use Topics  . Smoking status: Former Research scientist (life sciences)  . Smokeless tobacco: Never  Used  . Alcohol use Yes     Comment: ocassionally when not pregnant     Allergies   Patient has no known allergies.   Review of Systems Review of Systems  HENT: Positive for congestion, rhinorrhea, sinus pain and sinus pressure.   Cardiovascular: Positive for chest pain.  Gastrointestinal: Positive for abdominal pain.  All other systems reviewed and are negative.    Physical Exam Updated Vital Signs BP 102/62   Pulse 85   Temp 98.2 F (36.8 C) (Oral)   Resp 18   Ht 5\' 8"  (1.727 m)   Wt 104.3 kg   LMP 07/05/2016 (Exact Date)   SpO2 100%   BMI 34.97 kg/m   Physical Exam  Constitutional: She appears well-developed and  well-nourished. No distress.  Awake, alert, nontoxic appearance  HENT:  Head: Normocephalic and atraumatic.  Mouth/Throat: Oropharynx is clear and moist. No oropharyngeal exudate.  Eyes: Conjunctivae are normal. No scleral icterus.  Neck: Normal range of motion. Neck supple. No spinous process tenderness and no muscular tenderness present.  Cardiovascular: Normal rate, regular rhythm, normal heart sounds and intact distal pulses.   No murmur heard. Pulmonary/Chest: Effort normal and breath sounds normal. No respiratory distress. She has no wheezes.  Equal chest expansion Dry cough  Abdominal: Soft. Bowel sounds are normal. She exhibits no mass. There is no tenderness. There is CVA tenderness ( Left). There is no rebound and no guarding. Hernia confirmed negative in the right inguinal area and confirmed negative in the left inguinal area.  Genitourinary: Uterus normal. No labial fusion. There is no rash, tenderness or lesion on the right labia. There is no rash, tenderness or lesion on the left labia. Uterus is not deviated, not enlarged, not fixed and not tender. Cervix exhibits no motion tenderness, no discharge and no friability. Right adnexum displays no mass, no tenderness and no fullness. Left adnexum displays tenderness and fullness. Left adnexum displays no mass. No erythema, tenderness or bleeding in the vagina. No foreign body in the vagina. No signs of injury around the vagina. Vaginal discharge (thin, white, moderate amount) found.  Genitourinary Comments: No blood in the vaginal vault Cervical os closed  Musculoskeletal: Normal range of motion. She exhibits no edema.  No midline tenderness of the T-spine or L-spine Full range motion of the T-spine and L-spine  Lymphadenopathy:       Right: No inguinal adenopathy present.       Left: No inguinal adenopathy present.  Neurological: She is alert.  Speech is clear and goal oriented Moves extremities without ataxia Sensation intact in  the bilateral lower extremities Strength 5/5 in the bilateral lower extremities Patient ambulatory without difficulty  Skin: Skin is warm and dry. She is not diaphoretic. No erythema.  Psychiatric: She has a normal mood and affect.  Nursing note and vitals reviewed.    ED Treatments / Results  Labs (all labs ordered are listed, but only abnormal results are displayed) Labs Reviewed  WET PREP, GENITAL - Abnormal; Notable for the following:       Result Value   Clue Cells Wet Prep HPF POC PRESENT (*)    WBC, Wet Prep HPF POC MANY (*)    All other components within normal limits  BASIC METABOLIC PANEL - Abnormal; Notable for the following:    Glucose, Bld 104 (*)    Calcium 8.8 (*)    Anion gap 4 (*)    All other components within normal limits  URINALYSIS, ROUTINE W  REFLEX MICROSCOPIC - Abnormal; Notable for the following:    APPearance HAZY (*)    Leukocytes, UA TRACE (*)    Bacteria, UA RARE (*)    Squamous Epithelial / LPF 6-30 (*)    All other components within normal limits  HCG, QUANTITATIVE, PREGNANCY - Abnormal; Notable for the following:    hCG, Beta Chain, Quant, S 5,430 (*)    All other components within normal limits  I-STAT BETA HCG BLOOD, ED ([facility], [facility], [facility] ONLY) - Abnormal; Notable for the following:    I-stat hCG, quantitative >2,000.0 (*)    All other components within normal limits  URINE CULTURE  CBC  TROPONIN I  GC/CHLAMYDIA PROBE AMP ([place]) NOT AT [facility]    EKG  EKG Interpretation  Date/Time:  Saturday August 10 2016 19:49:37 EST Ventricular Rate:  92 PR Interval:  166 QRS Duration: 80 QT Interval:  354 QTC Calculation: 437 R Axis:   73 Text Interpretation:  Normal sinus rhythm Normal ECG No old tracing to compare Confirmed by Winfred Leeds  MD, SAM [phone]) on 08/11/2016 12:22:33 AM        Radiology Dg Chest 2 View  Result Date: 08/10/2016 CLINICAL DATA:  Chest pain. EXAM: CHEST  2 VIEW COMPARISON:  None. FINDINGS: The heart size and  mediastinal contours are within normal limits. Both lungs are clear. The visualized skeletal structures are unremarkable. IMPRESSION: No active cardiopulmonary disease. Electronically Signed   By: Lorriane Shire M.D.   On: 08/10/2016 20:23   US Ob Comp < 14 Wks  Result Date: 08/11/2016 CLINICAL DATA:  Left lower abdominal and back pain x1 week, beta HCG 5,430 EXAM: OBSTETRIC <14 WK [place] AND TRANSVAGINAL OB US COMPARISON:  None for this pregnancy TECHNIQUE: Both transabdominal and transvaginal ultrasound examinations were performed for complete evaluation of the gestation as well as the maternal uterus, adnexal regions, and pelvic cul-de-sac. Transvaginal technique was performed to assess early pregnancy. FINDINGS: Intrauterine gestational sac: Single Yolk sac:  Visualized Cardiac Activity: Not applicable. Heart Rate:  Not applicable. MSD: 6.3  mm   5 w   2  d Subchorionic hemorrhage:  None visualized. Maternal uterus/adnexae: Anterior uterine fibroid on the right measuring 1.5 x 1.2 x 1.5 cm, intramural in location. Small right adnexal cyst measuring 2.1 x 1.5 x 1.4 cm with small intramural nodule measuring 4 mm. Left ovary is unremarkable. IMPRESSION: 1. Probable early intrauterine gestational and yolk sac but fetal pole, or cardiac activity yet visualized. Recommend follow-up quantitative B-HCG levels and follow-up US in 14 days to assess viability. This recommendation follows SRU consensus guidelines: Diagnostic Criteria for Nonviable Pregnancy Early in the First Trimester. [place] [employer] Med 2013WM:705707. 2. Small right-sided intramural anterior uterine fibroid measuring 1.5 x 1.2 x 1.5 cm. 3. Small complex right adnexal cyst measuring 2.1 x 1.5 x 1.4 cm with small intramural nodule warranting short term followup and reassessment. Concomitant ectopic pregnancy would be extremely rare and less likely given beta HCG levels. Electronically Signed   By: Ashley Royalty M.D.   On: 08/11/2016 02:27   US Ob  Transvaginal  Result Date: 08/11/2016 CLINICAL DATA:  Left lower abdominal and back pain x1 week, beta HCG 5,430 EXAM: OBSTETRIC <14 WK [place] AND TRANSVAGINAL OB US COMPARISON:  None for this pregnancy TECHNIQUE: Both transabdominal and transvaginal ultrasound examinations were performed for complete evaluation of the gestation as well as the maternal uterus, adnexal regions, and pelvic cul-de-sac. Transvaginal technique was performed to assess early pregnancy.  FINDINGS: Intrauterine gestational sac: Single Yolk sac:  Visualized Cardiac Activity: Not applicable. Heart Rate:  Not applicable. MSD: 6.3  mm   5 w   2  d Subchorionic hemorrhage:  None visualized. Maternal uterus/adnexae: Anterior uterine fibroid on the right measuring 1.5 x 1.2 x 1.5 cm, intramural in location. Small right adnexal cyst measuring 2.1 x 1.5 x 1.4 cm with small intramural nodule measuring 4 mm. Left ovary is unremarkable. IMPRESSION: 1. Probable early intrauterine gestational and yolk sac but fetal pole, or cardiac activity yet visualized. Recommend follow-up quantitative B-HCG levels and follow-up US in 14 days to assess viability. This recommendation follows SRU consensus guidelines: Diagnostic Criteria for Nonviable Pregnancy Early in the First Trimester. [employer] Med 2013KT:048977. 2. Small right-sided intramural anterior uterine fibroid measuring 1.5 x 1.2 x 1.5 cm. 3. Small complex right adnexal cyst measuring 2.1 x 1.5 x 1.4 cm with small intramural nodule warranting short term followup and reassessment. Concomitant ectopic pregnancy would be extremely rare and less likely given beta HCG levels. Electronically Signed   By: Ashley Royalty M.D.   On: 08/11/2016 02:27    Procedures Procedures (including critical care time)  Medications Ordered in ED Medications  sodium chloride 0.9 % bolus 1,000 mL (0 mLs Intravenous Stopped 08/10/16 2345)  acetaminophen (TYLENOL) tablet 1,000 mg (1,000 mg Oral Given 08/10/16 2245)      Initial Impression / Assessment and Plan / ED Course  I have reviewed the triage vital signs and the nursing notes.  Pertinent labs & imaging results that were available during my care of the patient were reviewed by me and considered in my medical decision making (see chart for details).  Clinical Course    Chest pain is not likely of cardiac or pulmonary etiology d/t presentation, PERC negative, VSS, no tracheal deviation, no JVD or new murmur, RRR, breath sounds equal bilaterally, EKG without acute abnormalities, negative troponin, and negative CXR. Pt has been coughing and I suspect persistent cough from bronchitis.  Pt has been advised to return to the ED if CP becomes exertional, associated with diaphoresis or nausea, radiates to left jaw/arm, worsens or becomes concerning in any way. Pt appears reliable for follow up.  Pt Also with left lower quadrant abdominal pain. Pelvic exam reassuring. No clinical signs of infection. Ultrasound shows likely intrauterine pregnancy. Of note patient also has fibroid and right ovarian cyst.  Discussed these findings with patient. She will need 48 hour follow-up for repeat Quant and close OB/GYN follow-up for further assessment of right ovarian cyst. Patient and significant other state understanding and are in agreement with the plan. Discussed return to this emergency department, [facility] or [facility] if patient develops worsening pain, persistent vomiting, high fevers or vaginal bleeding.  Case has been discussed with Dr. Winfred Leeds.  Final Clinical Impressions(s) / ED Diagnoses   Final diagnoses:  Pregnancy  Abdominal pain  Cough  Right ovarian cyst  Uterine leiomyoma, unspecified location    New Prescriptions Discharge Medication List as of 08/11/2016  2:55 AM    START taking these medications   Details  Prenatal Vit-Fe Fumarate-FA (PRENATAL COMPLETE) 14-0.4 MG TABS Take 2 tablets by mouth daily., Starting Sun 08/11/2016, Print          Jarrett Soho Ksenia Kunz, PA-C 08/11/16 [place], MD 08/12/16 [phone]

## 2016-08-10 NOTE — ED Triage Notes (Signed)
Pt from home with complaint of chest pressure that has been going on "a few days" accompanied by productive cough with yellowish green sputum. Pt's husband has pneumonia. Pt is [redacted] week pregnant "she thinks" Pt has hx of atrial septal defect and ventricular septal defect.

## 2016-08-11 ENCOUNTER — Emergency Department (HOSPITAL_COMMUNITY): Payer: Medicaid Other

## 2016-08-11 LAB — WET PREP, GENITAL
SPERM: NONE SEEN
Trich, Wet Prep: NONE SEEN
Yeast Wet Prep HPF POC: NONE SEEN

## 2016-08-11 MED ORDER — PRENATAL COMPLETE 14-0.4 MG PO TABS: 2.0000 | ORAL_TABLET | Freq: Every day | ORAL | 0 refills | Status: AC

## 2016-08-11 NOTE — ED Notes (Signed)
Pt was given a gingerale.

## 2016-08-11 NOTE — Discharge Instructions (Signed)
1. Medications: prenatal vitamins, tylenol for pain, usual home medications 2. Treatment: rest, drink plenty of fluids,  3. Follow Up: Please followup with your OB/GYN this week for discussion of your diagnoses and further evaluation after today's visit; if you do not have a primary care doctor use the resource guide provided to find one; Please return to the ER for vaginal bleeding, fevers, worsening abdominal pain or other concerns

## 2016-08-11 NOTE — ED Notes (Signed)
Pt understood dc material. NAD noted. Script given at dc  

## 2016-08-12 LAB — URINE CULTURE

## 2016-08-13 LAB — GC/CHLAMYDIA PROBE AMP (~~LOC~~) NOT AT ARMC
Chlamydia: NEGATIVE
Neisseria Gonorrhea: NEGATIVE

## 2018-05-13 IMAGING — DX DG CHEST 2V
2 series · 2 of 2 positions shown · non-contrast
Comparison: None.

CLINICAL DATA: Chest pain.

EXAM:
CHEST  2 VIEW

[chest pa]
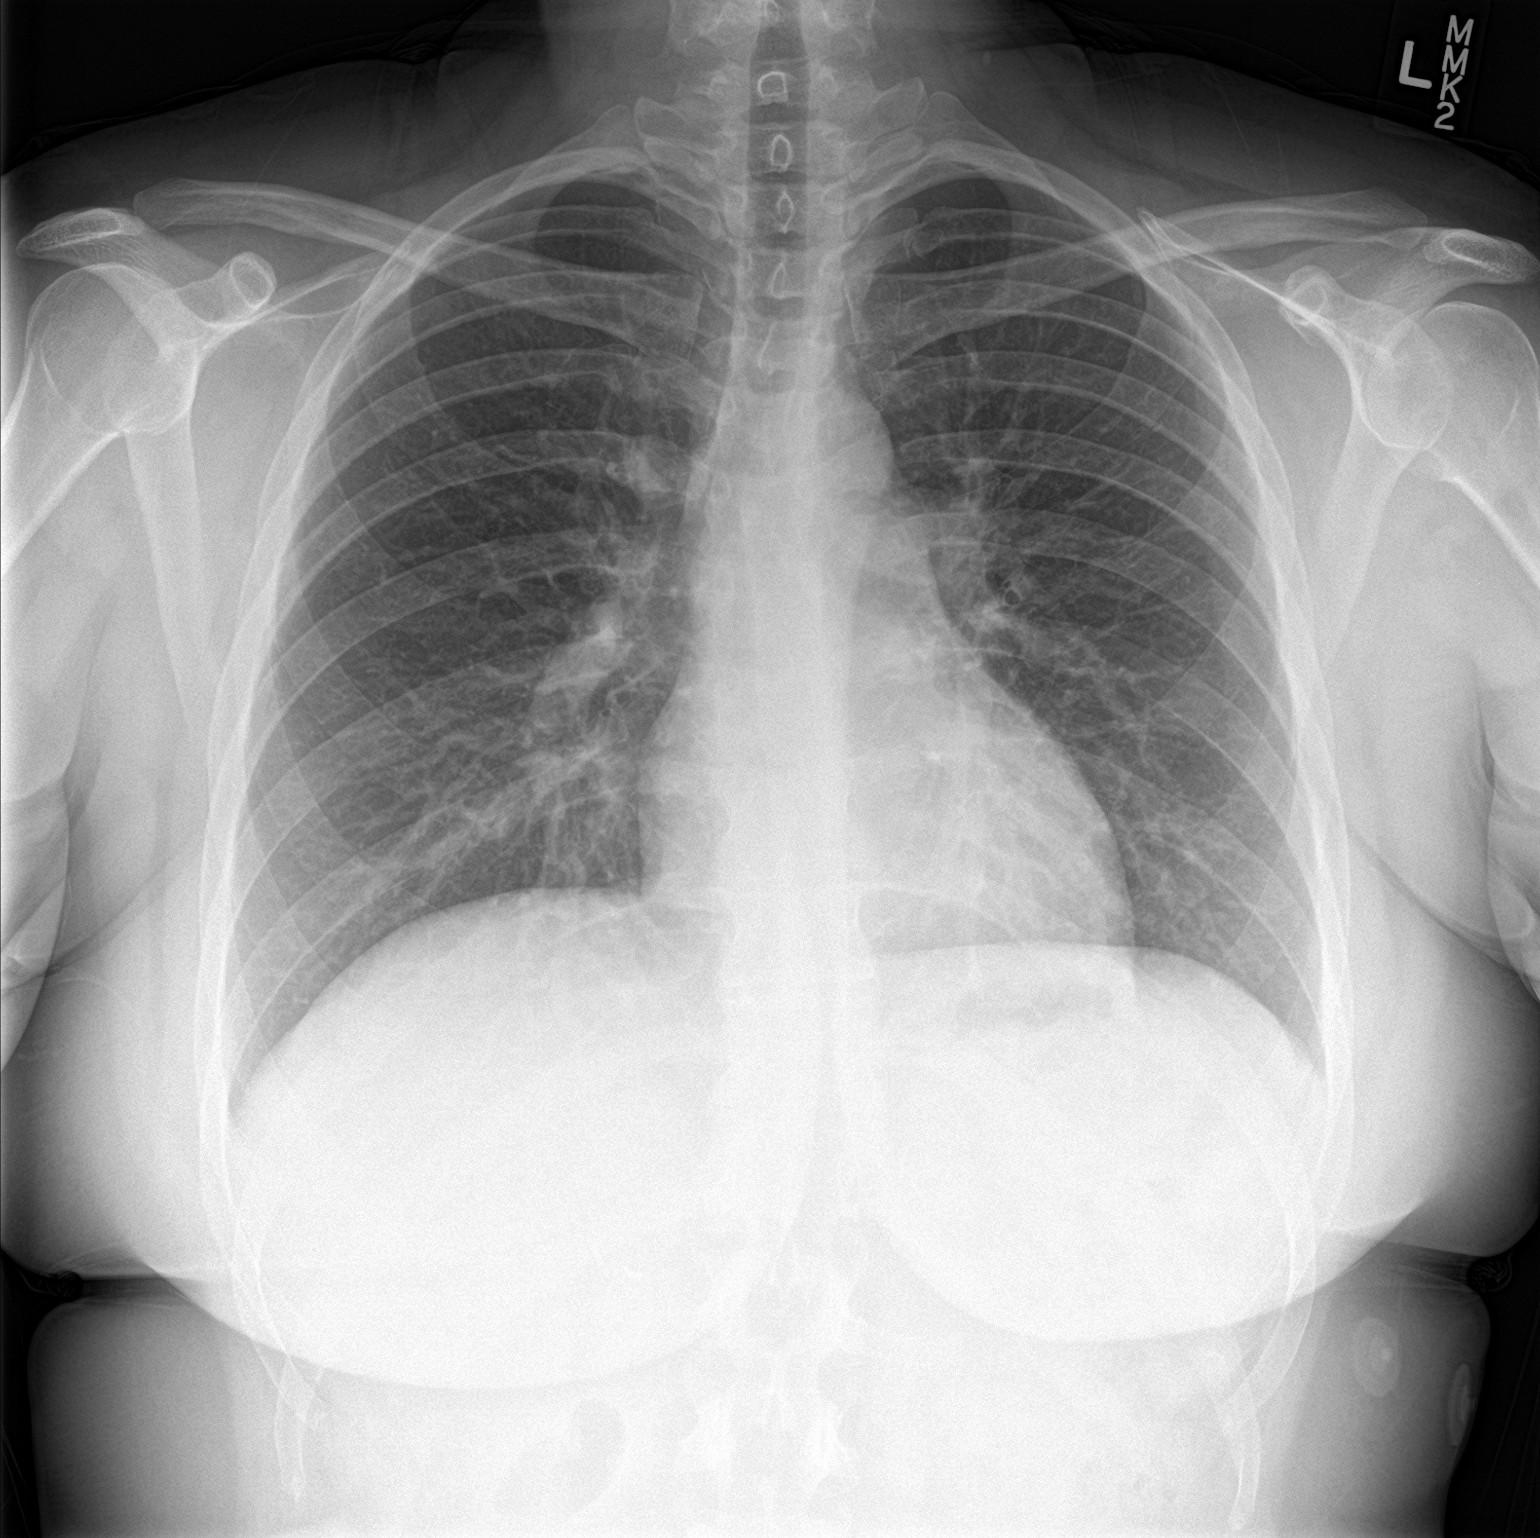

[chest lat]
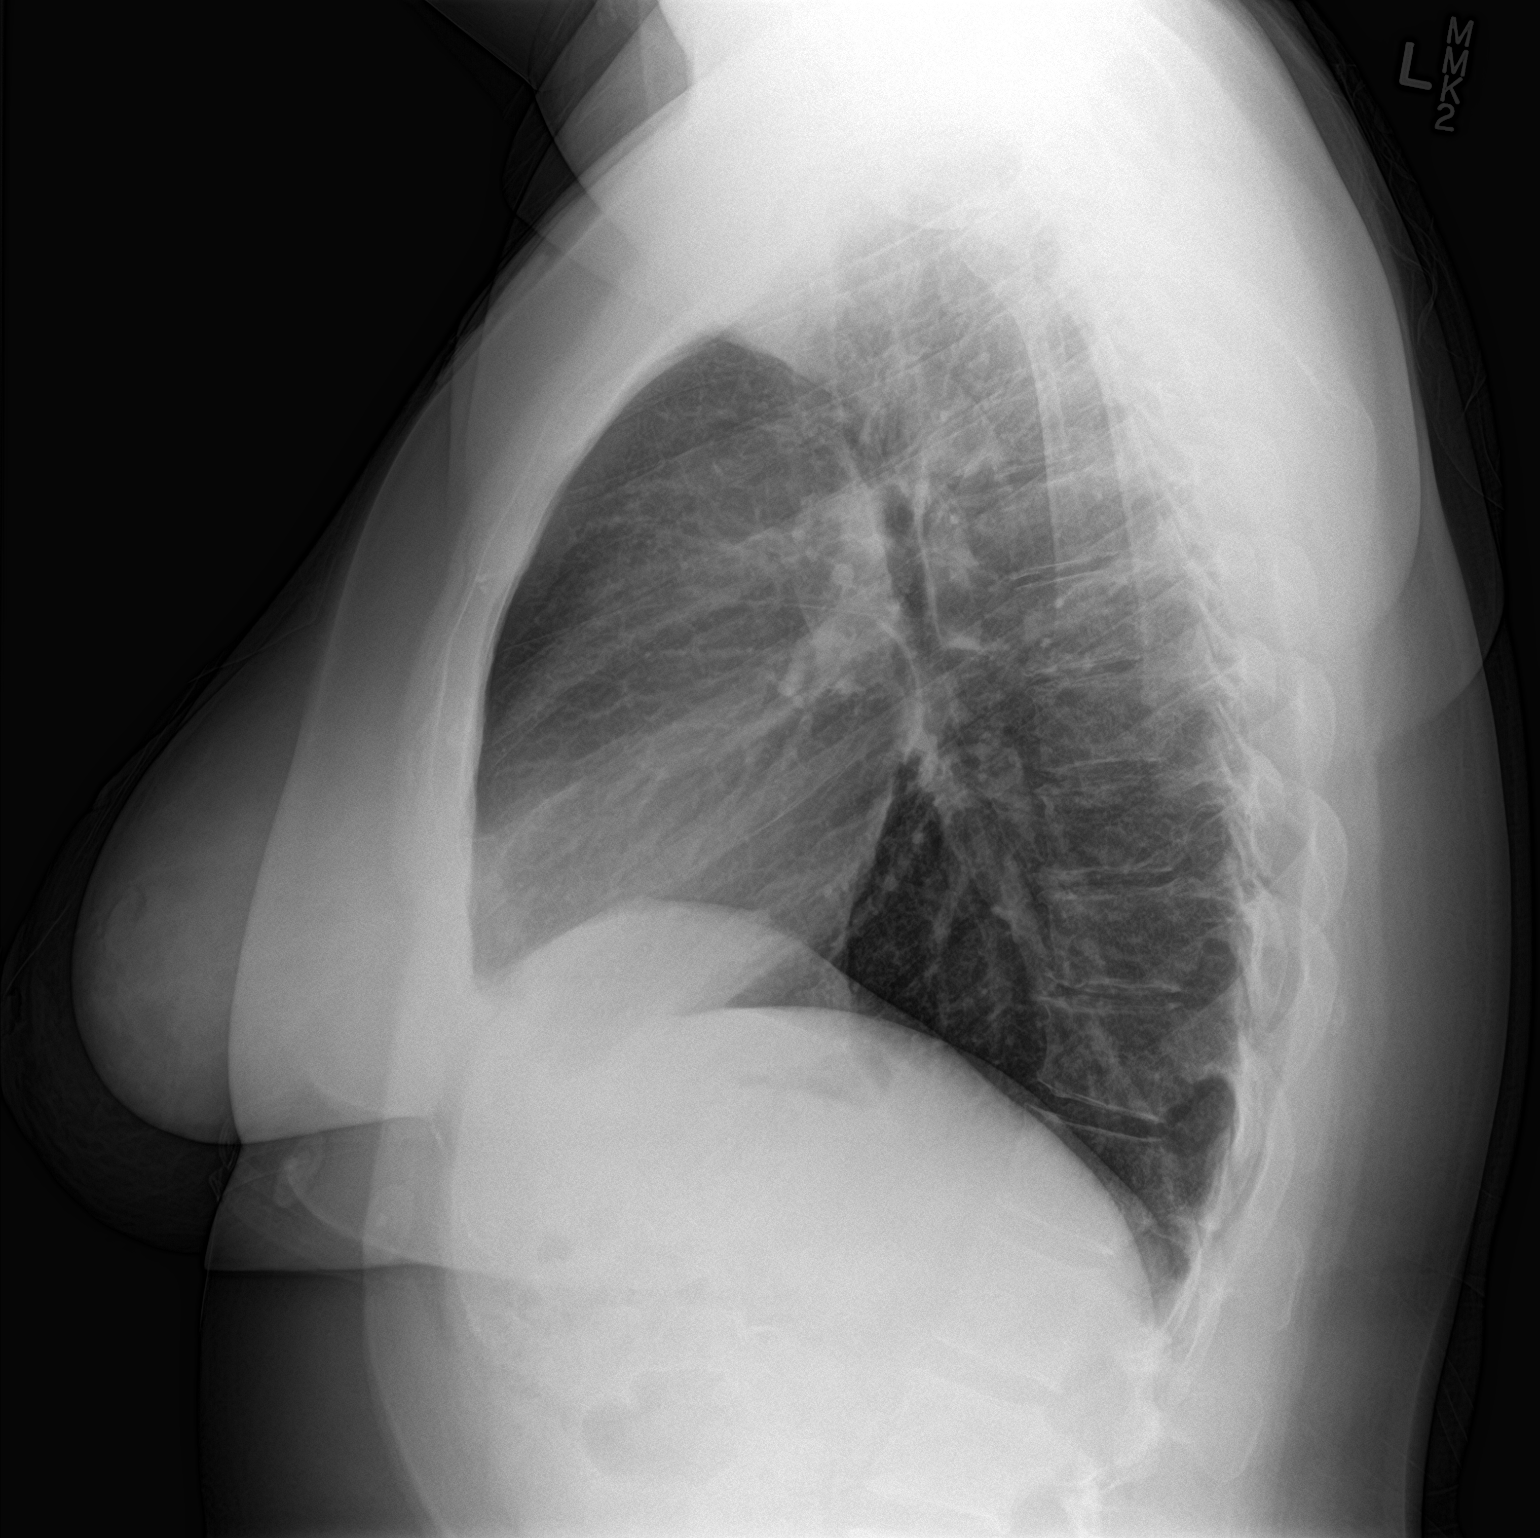

[2 of 2 positions shown; findings below may reference images not displayed]

FINDINGS: The heart size and mediastinal contours are within normal limits.
Both lungs are clear. The visualized skeletal structures are
unremarkable.
IMPRESSION: No active cardiopulmonary disease.
